# Patient Record
Sex: Female | Born: 1975 | Race: White | Hispanic: No | Marital: Married | Smoking: Never smoker
Health system: Southern US, Community
[De-identification: ages and names within clinical notes are randomized; demographics above are authoritative.]

## PROBLEM LIST (undated history)

## (undated) DIAGNOSIS — E559 Vitamin D deficiency, unspecified: Secondary | ICD-10-CM

## (undated) DIAGNOSIS — M549 Dorsalgia, unspecified: Secondary | ICD-10-CM

## (undated) DIAGNOSIS — R42 Dizziness and giddiness: Secondary | ICD-10-CM

## (undated) DIAGNOSIS — M542 Cervicalgia: Secondary | ICD-10-CM

## (undated) DIAGNOSIS — C4491 Basal cell carcinoma of skin, unspecified: Secondary | ICD-10-CM

## (undated) HISTORY — DX: Dorsalgia, unspecified: M54.9

## (undated) HISTORY — DX: Basal cell carcinoma of skin, unspecified: C44.91

## (undated) HISTORY — PX: APPENDECTOMY: SHX54

## (undated) HISTORY — DX: Vitamin D deficiency, unspecified: E55.9

## (undated) HISTORY — PX: BASAL CELL CARCINOMA EXCISION: SHX1214

## (undated) HISTORY — DX: Cervicalgia: M54.2

## (undated) HISTORY — DX: Dizziness and giddiness: R42

---

## 2015-11-21 DIAGNOSIS — Z01419 Encounter for gynecological examination (general) (routine) without abnormal findings: Secondary | ICD-10-CM | POA: Diagnosis not present

## 2015-11-21 DIAGNOSIS — Z6826 Body mass index (BMI) 26.0-26.9, adult: Secondary | ICD-10-CM | POA: Diagnosis not present

## 2015-11-26 DIAGNOSIS — M47816 Spondylosis without myelopathy or radiculopathy, lumbar region: Secondary | ICD-10-CM | POA: Diagnosis not present

## 2015-11-26 DIAGNOSIS — M9901 Segmental and somatic dysfunction of cervical region: Secondary | ICD-10-CM | POA: Diagnosis not present

## 2015-11-26 DIAGNOSIS — M9902 Segmental and somatic dysfunction of thoracic region: Secondary | ICD-10-CM | POA: Diagnosis not present

## 2015-11-26 DIAGNOSIS — M9903 Segmental and somatic dysfunction of lumbar region: Secondary | ICD-10-CM | POA: Diagnosis not present

## 2015-11-30 DIAGNOSIS — D2271 Melanocytic nevi of right lower limb, including hip: Secondary | ICD-10-CM | POA: Diagnosis not present

## 2015-11-30 DIAGNOSIS — D225 Melanocytic nevi of trunk: Secondary | ICD-10-CM | POA: Diagnosis not present

## 2015-11-30 DIAGNOSIS — D2272 Melanocytic nevi of left lower limb, including hip: Secondary | ICD-10-CM | POA: Diagnosis not present

## 2015-11-30 DIAGNOSIS — D2239 Melanocytic nevi of other parts of face: Secondary | ICD-10-CM | POA: Diagnosis not present

## 2015-12-11 DIAGNOSIS — M9902 Segmental and somatic dysfunction of thoracic region: Secondary | ICD-10-CM | POA: Diagnosis not present

## 2015-12-11 DIAGNOSIS — M9903 Segmental and somatic dysfunction of lumbar region: Secondary | ICD-10-CM | POA: Diagnosis not present

## 2015-12-11 DIAGNOSIS — M47816 Spondylosis without myelopathy or radiculopathy, lumbar region: Secondary | ICD-10-CM | POA: Diagnosis not present

## 2015-12-11 DIAGNOSIS — M9901 Segmental and somatic dysfunction of cervical region: Secondary | ICD-10-CM | POA: Diagnosis not present

## 2016-01-01 DIAGNOSIS — M9901 Segmental and somatic dysfunction of cervical region: Secondary | ICD-10-CM | POA: Diagnosis not present

## 2016-01-01 DIAGNOSIS — M9902 Segmental and somatic dysfunction of thoracic region: Secondary | ICD-10-CM | POA: Diagnosis not present

## 2016-01-01 DIAGNOSIS — M9903 Segmental and somatic dysfunction of lumbar region: Secondary | ICD-10-CM | POA: Diagnosis not present

## 2016-01-01 DIAGNOSIS — M47816 Spondylosis without myelopathy or radiculopathy, lumbar region: Secondary | ICD-10-CM | POA: Diagnosis not present

## 2016-01-17 DIAGNOSIS — M9902 Segmental and somatic dysfunction of thoracic region: Secondary | ICD-10-CM | POA: Diagnosis not present

## 2016-01-17 DIAGNOSIS — M9901 Segmental and somatic dysfunction of cervical region: Secondary | ICD-10-CM | POA: Diagnosis not present

## 2016-01-17 DIAGNOSIS — M47816 Spondylosis without myelopathy or radiculopathy, lumbar region: Secondary | ICD-10-CM | POA: Diagnosis not present

## 2016-01-17 DIAGNOSIS — M9903 Segmental and somatic dysfunction of lumbar region: Secondary | ICD-10-CM | POA: Diagnosis not present

## 2016-02-04 DIAGNOSIS — M9902 Segmental and somatic dysfunction of thoracic region: Secondary | ICD-10-CM | POA: Diagnosis not present

## 2016-02-04 DIAGNOSIS — M47816 Spondylosis without myelopathy or radiculopathy, lumbar region: Secondary | ICD-10-CM | POA: Diagnosis not present

## 2016-02-04 DIAGNOSIS — M9903 Segmental and somatic dysfunction of lumbar region: Secondary | ICD-10-CM | POA: Diagnosis not present

## 2016-02-04 DIAGNOSIS — M9901 Segmental and somatic dysfunction of cervical region: Secondary | ICD-10-CM | POA: Diagnosis not present

## 2016-02-06 DIAGNOSIS — M9903 Segmental and somatic dysfunction of lumbar region: Secondary | ICD-10-CM | POA: Diagnosis not present

## 2016-02-06 DIAGNOSIS — M47816 Spondylosis without myelopathy or radiculopathy, lumbar region: Secondary | ICD-10-CM | POA: Diagnosis not present

## 2016-02-06 DIAGNOSIS — M9901 Segmental and somatic dysfunction of cervical region: Secondary | ICD-10-CM | POA: Diagnosis not present

## 2016-02-06 DIAGNOSIS — M9902 Segmental and somatic dysfunction of thoracic region: Secondary | ICD-10-CM | POA: Diagnosis not present

## 2016-02-11 DIAGNOSIS — M47816 Spondylosis without myelopathy or radiculopathy, lumbar region: Secondary | ICD-10-CM | POA: Diagnosis not present

## 2016-02-11 DIAGNOSIS — M9902 Segmental and somatic dysfunction of thoracic region: Secondary | ICD-10-CM | POA: Diagnosis not present

## 2016-02-11 DIAGNOSIS — M9903 Segmental and somatic dysfunction of lumbar region: Secondary | ICD-10-CM | POA: Diagnosis not present

## 2016-02-11 DIAGNOSIS — M9901 Segmental and somatic dysfunction of cervical region: Secondary | ICD-10-CM | POA: Diagnosis not present

## 2016-02-13 DIAGNOSIS — M47816 Spondylosis without myelopathy or radiculopathy, lumbar region: Secondary | ICD-10-CM | POA: Diagnosis not present

## 2016-02-13 DIAGNOSIS — M9903 Segmental and somatic dysfunction of lumbar region: Secondary | ICD-10-CM | POA: Diagnosis not present

## 2016-02-13 DIAGNOSIS — M9902 Segmental and somatic dysfunction of thoracic region: Secondary | ICD-10-CM | POA: Diagnosis not present

## 2016-02-13 DIAGNOSIS — M9901 Segmental and somatic dysfunction of cervical region: Secondary | ICD-10-CM | POA: Diagnosis not present

## 2016-02-26 DIAGNOSIS — M9903 Segmental and somatic dysfunction of lumbar region: Secondary | ICD-10-CM | POA: Diagnosis not present

## 2016-02-26 DIAGNOSIS — M9902 Segmental and somatic dysfunction of thoracic region: Secondary | ICD-10-CM | POA: Diagnosis not present

## 2016-02-26 DIAGNOSIS — M47816 Spondylosis without myelopathy or radiculopathy, lumbar region: Secondary | ICD-10-CM | POA: Diagnosis not present

## 2016-02-26 DIAGNOSIS — M9901 Segmental and somatic dysfunction of cervical region: Secondary | ICD-10-CM | POA: Diagnosis not present

## 2016-02-28 DIAGNOSIS — R42 Dizziness and giddiness: Secondary | ICD-10-CM | POA: Diagnosis not present

## 2016-03-04 ENCOUNTER — Encounter: Payer: Self-pay | Admitting: Neurology

## 2016-03-04 ENCOUNTER — Ambulatory Visit (INDEPENDENT_AMBULATORY_CARE_PROVIDER_SITE_OTHER): Payer: BLUE CROSS/BLUE SHIELD | Admitting: Neurology

## 2016-03-04 VITALS — BP 103/71 | HR 57 | Ht 67.5 in | Wt 166.5 lb

## 2016-03-04 DIAGNOSIS — M542 Cervicalgia: Secondary | ICD-10-CM | POA: Diagnosis not present

## 2016-03-04 DIAGNOSIS — R42 Dizziness and giddiness: Secondary | ICD-10-CM

## 2016-03-04 NOTE — Progress Notes (Signed)
PATIENT: Holly Atkinson DOB: April 14, 1976  Chief Complaint  Patient presents with  . Dizziness    Lying: 103/71, 57, Sittting: 104/69, 67, Standing: 106/76, 72.  She started having intermittent dizzy spells on 02/04/16, after returning home from a work trip.  Her symptoms are typically better when she wakes up the mornings and gets worse throughout the day.  She has chronic back/neck pain and sees a chiropractor regularly.     HISTORICAL  Holly Atkinson is a 40 years old right-handed female, seen in refer by her primary care physician Dr. Deland Pretty for evaluation of dizziness on March 04 2016  I reviewed and summarized in note, in May and June, she traveled extensively, when she gets back home, around February 04 2016, she noticed intermittent dizziness, described as feeling fuzzy, generalized fatigue, difficulty concentrating, she has to repeat message few times on her computer to be able to respond, she denies visual loss, no vertigo, no swallowing difficulty, no lateralized motor or sensory deficit, no headaches, no loss of consciousness,  She was diagnosed with subclinical hypothyroidism at [facility] in December 2016, was not given medication treatment, recent laboratory evaluation CBC CMP showed no significant abnormality per patient.  She denies history of migraine headaches  REVIEW OF SYSTEMS: Full 14 system review of systems performed and notable only for Achy muscles, dizziness, fatigue  ALLERGIES: Allergies  Allergen Reactions  . Penicillins Nausea And Vomiting    HOME MEDICATIONS: Current Outpatient Prescriptions  Medication Sig Dispense Refill  . L-GLUTAMINE PO Take by mouth daily.    . Probiotic Product (PROBIOMAX DAILY DF PO) Take by mouth daily.     No current facility-administered medications for this visit.    PAST MEDICAL HISTORY: Past Medical History  Diagnosis Date  . Vitamin D deficiency   . Basal cell carcinoma   . Dizziness   . Neck pain   .  Back pain     PAST SURGICAL HISTORY: Past Surgical History  Procedure Laterality Date  . Appendectomy    . Basal cell carcinoma excision      FAMILY HISTORY: Family History  Problem Relation Age of Onset  . Prostate cancer Father   . Prostate cancer Paternal Grandfather   . Arthritis Maternal Grandmother   . Arthritis Paternal Grandmother   . Healthy Mother     SOCIAL HISTORY:  Social History   Social History  . Marital Status: Married    Spouse Name: N/A  . Number of Children: 0  . Years of Education: Bachelors   Occupational History  . Contractor/Educational    Social History Main Topics  . Smoking status: Never Smoker   . Smokeless tobacco: Not on file  . Alcohol Use: 0.0 oz/week    0 Standard drinks or equivalent per week     Comment: 4-6 ounces of alcohol, 2-3 times weekly.  . Drug Use: No  . Sexual Activity: Not on file   Other Topics Concern  . Not on file   Social History Narrative   Lives at home with husband.   Right-handed.   1-2 cups of coffee weekly.     PHYSICAL EXAM   Filed Vitals:   03/04/16 1317  BP: 103/71  Pulse: 57  Height: 5' 7.5" (1.715 m)  Weight: 166 lb 8 oz (75.524 kg)    Not recorded      Body mass index is 25.68 kg/(m^2).  PHYSICAL EXAMNIATION:  Gen: NAD, conversant, well nourised, obese, well groomed  Cardiovascular: Regular rate rhythm, no peripheral edema, warm, nontender. Eyes: Conjunctivae clear without exudates or hemorrhage Neck: Supple, no carotid bruise. Pulmonary: Clear to auscultation bilaterally   NEUROLOGICAL EXAM:  MENTAL STATUS: Speech:    Speech is normal; fluent and spontaneous with normal comprehension.  Cognition:     Orientation to time, place and person     Normal recent and remote memory     Normal Attention span and concentration     Normal Language, naming, repeating,spontaneous speech     Fund of knowledge   CRANIAL NERVES: CN II: Visual fields are full to  confrontation. Fundoscopic exam is normal with sharp discs and no vascular changes. Pupils are round equal and briskly reactive to light. CN III, IV, VI: extraocular movement are normal. No ptosis. CN V: Facial sensation is intact to pinprick in all 3 divisions bilaterally. Corneal responses are intact.  CN VII: Face is symmetric with normal eye closure and smile. CN VIII: Hearing is normal to rubbing fingers CN IX, X: Palate elevates symmetrically. Phonation is normal. CN XI: Head turning and shoulder shrug are intact CN XII: Tongue is midline with normal movements and no atrophy.  MOTOR: There is no pronator drift of out-stretched arms. Muscle bulk and tone are normal. Muscle strength is normal.  REFLEXES: Reflexes are 2+ and symmetric at the biceps, triceps, knees, and ankles. Plantar responses are flexor.  SENSORY: Intact to light touch, pinprick, positional sensation and vibratory sensation are intact in fingers and toes.  COORDINATION: Rapid alternating movements and fine finger movements are intact. There is no dysmetria on finger-to-nose and heel-knee-shin.    GAIT/STANCE: Posture is normal. Gait is steady with normal steps, base, arm swing, and turning. Heel and toe walking are normal. Tandem gait is normal.  Romberg is absent.   DIAGNOSTIC DATA (LABS, IMAGING, TESTING) - I reviewed patient records, labs, notes, testing and imaging myself where available.   ASSESSMENT AND PLAN  Holly Atkinson is a 40 y.o. female   New onset dizziness sensation, difficulty focus  Normal neurological examination, no focal signs and no focal complaints  Differentiation diagnosis including migraine variants, versus thyroid malfunction  Check TSH, B12, Vit D level, I will release result to My chart,   Return to clinic for persistent symptom     Marcial Pacas, M.D. Ph.D.  [facility] [address] [place], [place] [address] Ph: [phone] Fax:  [phone]  CC: Deland Pretty, MD

## 2016-03-05 ENCOUNTER — Telehealth: Payer: Self-pay | Admitting: Neurology

## 2016-03-05 DIAGNOSIS — M9901 Segmental and somatic dysfunction of cervical region: Secondary | ICD-10-CM | POA: Diagnosis not present

## 2016-03-05 DIAGNOSIS — M47816 Spondylosis without myelopathy or radiculopathy, lumbar region: Secondary | ICD-10-CM | POA: Diagnosis not present

## 2016-03-05 DIAGNOSIS — M9902 Segmental and somatic dysfunction of thoracic region: Secondary | ICD-10-CM | POA: Diagnosis not present

## 2016-03-05 DIAGNOSIS — M9903 Segmental and somatic dysfunction of lumbar region: Secondary | ICD-10-CM | POA: Diagnosis not present

## 2016-03-05 LAB — THYROID PANEL WITH TSH
Free Thyroxine Index: 1.7 (ref 1.2–4.9)
T3 Uptake Ratio: 26 % (ref 24–39)
T4, Total: 6.6 ug/dL (ref 4.5–12.0)
TSH: 3.03 u[IU]/mL (ref 0.450–4.500)

## 2016-03-05 LAB — VITAMIN B12: Vitamin B-12: 373 pg/mL (ref 211–946)

## 2016-03-05 LAB — VITAMIN D 25 HYDROXY (VIT D DEFICIENCY, FRACTURES): Vit D, 25-Hydroxy: 60.6 ng/mL (ref 30.0–100.0)

## 2016-03-05 NOTE — Telephone Encounter (Signed)
Please call patient for normal laboratory evaluation

## 2016-03-05 NOTE — Telephone Encounter (Signed)
Notified patient of normal results.

## 2016-03-10 NOTE — Telephone Encounter (Signed)
She has placed a call to the mychart help desk for records review.  She would like her results faxed to Dr. Gwyndolyn Saxon in [place] for her appt tomorrow [phone]).  Information faxed and confirmed.

## 2016-03-10 NOTE — Telephone Encounter (Signed)
Left a message for a return call.

## 2016-03-10 NOTE — Telephone Encounter (Signed)
Patient called to advise, results are not available in MyChart yet.

## 2016-03-10 NOTE — Telephone Encounter (Signed)
Noted/fim 

## 2016-03-10 NOTE — Telephone Encounter (Signed)
Patient also advises these results are time sensitive, has appointment tomorrow and would like these results to be available in MyChart for this appointment.

## 2016-03-18 DIAGNOSIS — E039 Hypothyroidism, unspecified: Secondary | ICD-10-CM | POA: Diagnosis not present

## 2016-04-01 DIAGNOSIS — M9902 Segmental and somatic dysfunction of thoracic region: Secondary | ICD-10-CM | POA: Diagnosis not present

## 2016-04-01 DIAGNOSIS — M47816 Spondylosis without myelopathy or radiculopathy, lumbar region: Secondary | ICD-10-CM | POA: Diagnosis not present

## 2016-04-01 DIAGNOSIS — M9903 Segmental and somatic dysfunction of lumbar region: Secondary | ICD-10-CM | POA: Diagnosis not present

## 2016-04-01 DIAGNOSIS — M9901 Segmental and somatic dysfunction of cervical region: Secondary | ICD-10-CM | POA: Diagnosis not present

## 2016-04-10 DIAGNOSIS — G4720 Circadian rhythm sleep disorder, unspecified type: Secondary | ICD-10-CM | POA: Diagnosis not present

## 2016-04-15 DIAGNOSIS — M9902 Segmental and somatic dysfunction of thoracic region: Secondary | ICD-10-CM | POA: Diagnosis not present

## 2016-04-15 DIAGNOSIS — M47816 Spondylosis without myelopathy or radiculopathy, lumbar region: Secondary | ICD-10-CM | POA: Diagnosis not present

## 2016-04-15 DIAGNOSIS — M9903 Segmental and somatic dysfunction of lumbar region: Secondary | ICD-10-CM | POA: Diagnosis not present

## 2016-04-15 DIAGNOSIS — M9901 Segmental and somatic dysfunction of cervical region: Secondary | ICD-10-CM | POA: Diagnosis not present

## 2016-04-18 DIAGNOSIS — R410 Disorientation, unspecified: Secondary | ICD-10-CM | POA: Diagnosis not present

## 2016-04-18 DIAGNOSIS — E039 Hypothyroidism, unspecified: Secondary | ICD-10-CM | POA: Diagnosis not present

## 2016-04-18 DIAGNOSIS — R42 Dizziness and giddiness: Secondary | ICD-10-CM | POA: Diagnosis not present

## 2016-05-01 DIAGNOSIS — M9902 Segmental and somatic dysfunction of thoracic region: Secondary | ICD-10-CM | POA: Diagnosis not present

## 2016-05-01 DIAGNOSIS — M9903 Segmental and somatic dysfunction of lumbar region: Secondary | ICD-10-CM | POA: Diagnosis not present

## 2016-05-01 DIAGNOSIS — M47816 Spondylosis without myelopathy or radiculopathy, lumbar region: Secondary | ICD-10-CM | POA: Diagnosis not present

## 2016-05-01 DIAGNOSIS — M9901 Segmental and somatic dysfunction of cervical region: Secondary | ICD-10-CM | POA: Diagnosis not present

## 2016-05-14 DIAGNOSIS — M9901 Segmental and somatic dysfunction of cervical region: Secondary | ICD-10-CM | POA: Diagnosis not present

## 2016-05-14 DIAGNOSIS — M9903 Segmental and somatic dysfunction of lumbar region: Secondary | ICD-10-CM | POA: Diagnosis not present

## 2016-05-14 DIAGNOSIS — M9902 Segmental and somatic dysfunction of thoracic region: Secondary | ICD-10-CM | POA: Diagnosis not present

## 2016-05-14 DIAGNOSIS — M47816 Spondylosis without myelopathy or radiculopathy, lumbar region: Secondary | ICD-10-CM | POA: Diagnosis not present

## 2016-06-10 DIAGNOSIS — M9901 Segmental and somatic dysfunction of cervical region: Secondary | ICD-10-CM | POA: Diagnosis not present

## 2016-06-10 DIAGNOSIS — M9902 Segmental and somatic dysfunction of thoracic region: Secondary | ICD-10-CM | POA: Diagnosis not present

## 2016-06-10 DIAGNOSIS — M9903 Segmental and somatic dysfunction of lumbar region: Secondary | ICD-10-CM | POA: Diagnosis not present

## 2016-06-10 DIAGNOSIS — M47816 Spondylosis without myelopathy or radiculopathy, lumbar region: Secondary | ICD-10-CM | POA: Diagnosis not present

## 2016-06-12 ENCOUNTER — Other Ambulatory Visit: Payer: Self-pay | Admitting: Obstetrics and Gynecology

## 2016-06-12 DIAGNOSIS — N6310 Unspecified lump in the right breast, unspecified quadrant: Secondary | ICD-10-CM

## 2016-06-13 ENCOUNTER — Ambulatory Visit
Admission: RE | Admit: 2016-06-13 | Discharge: 2016-06-13 | Disposition: A | Discharge status: Home / Self Care | Payer: BLUE CROSS/BLUE SHIELD | Source: Ambulatory Visit | Attending: Obstetrics and Gynecology | Admitting: Obstetrics and Gynecology

## 2016-06-13 DIAGNOSIS — N6313 Unspecified lump in the right breast, lower outer quadrant: Secondary | ICD-10-CM | POA: Diagnosis not present

## 2016-06-13 DIAGNOSIS — N6310 Unspecified lump in the right breast, unspecified quadrant: Secondary | ICD-10-CM

## 2016-07-07 DIAGNOSIS — M9902 Segmental and somatic dysfunction of thoracic region: Secondary | ICD-10-CM | POA: Diagnosis not present

## 2016-07-07 DIAGNOSIS — M9903 Segmental and somatic dysfunction of lumbar region: Secondary | ICD-10-CM | POA: Diagnosis not present

## 2016-07-07 DIAGNOSIS — M47816 Spondylosis without myelopathy or radiculopathy, lumbar region: Secondary | ICD-10-CM | POA: Diagnosis not present

## 2016-07-07 DIAGNOSIS — M9901 Segmental and somatic dysfunction of cervical region: Secondary | ICD-10-CM | POA: Diagnosis not present

## 2016-07-08 DIAGNOSIS — N6001 Solitary cyst of right breast: Secondary | ICD-10-CM | POA: Diagnosis not present

## 2016-08-01 DIAGNOSIS — Z0000 Encounter for general adult medical examination without abnormal findings: Secondary | ICD-10-CM | POA: Diagnosis not present

## 2016-08-01 DIAGNOSIS — E559 Vitamin D deficiency, unspecified: Secondary | ICD-10-CM | POA: Diagnosis not present

## 2016-08-06 DIAGNOSIS — M9903 Segmental and somatic dysfunction of lumbar region: Secondary | ICD-10-CM | POA: Diagnosis not present

## 2016-08-06 DIAGNOSIS — M9901 Segmental and somatic dysfunction of cervical region: Secondary | ICD-10-CM | POA: Diagnosis not present

## 2016-08-06 DIAGNOSIS — M47816 Spondylosis without myelopathy or radiculopathy, lumbar region: Secondary | ICD-10-CM | POA: Diagnosis not present

## 2016-08-06 DIAGNOSIS — M9902 Segmental and somatic dysfunction of thoracic region: Secondary | ICD-10-CM | POA: Diagnosis not present

## 2016-08-08 DIAGNOSIS — R42 Dizziness and giddiness: Secondary | ICD-10-CM | POA: Diagnosis not present

## 2016-08-08 DIAGNOSIS — E039 Hypothyroidism, unspecified: Secondary | ICD-10-CM | POA: Diagnosis not present

## 2016-08-08 DIAGNOSIS — R410 Disorientation, unspecified: Secondary | ICD-10-CM | POA: Diagnosis not present

## 2016-08-19 DIAGNOSIS — M9901 Segmental and somatic dysfunction of cervical region: Secondary | ICD-10-CM | POA: Diagnosis not present

## 2016-08-19 DIAGNOSIS — Z0000 Encounter for general adult medical examination without abnormal findings: Secondary | ICD-10-CM | POA: Diagnosis not present

## 2016-08-19 DIAGNOSIS — M542 Cervicalgia: Secondary | ICD-10-CM | POA: Diagnosis not present

## 2016-08-19 DIAGNOSIS — M9903 Segmental and somatic dysfunction of lumbar region: Secondary | ICD-10-CM | POA: Diagnosis not present

## 2016-08-19 DIAGNOSIS — D72818 Other decreased white blood cell count: Secondary | ICD-10-CM | POA: Diagnosis not present

## 2016-08-19 DIAGNOSIS — M47816 Spondylosis without myelopathy or radiculopathy, lumbar region: Secondary | ICD-10-CM | POA: Diagnosis not present

## 2016-08-19 DIAGNOSIS — Z8639 Personal history of other endocrine, nutritional and metabolic disease: Secondary | ICD-10-CM | POA: Diagnosis not present

## 2016-08-19 DIAGNOSIS — E039 Hypothyroidism, unspecified: Secondary | ICD-10-CM | POA: Diagnosis not present

## 2016-08-19 DIAGNOSIS — M9902 Segmental and somatic dysfunction of thoracic region: Secondary | ICD-10-CM | POA: Diagnosis not present

## 2016-09-02 DIAGNOSIS — M47816 Spondylosis without myelopathy or radiculopathy, lumbar region: Secondary | ICD-10-CM | POA: Diagnosis not present

## 2016-09-02 DIAGNOSIS — M9903 Segmental and somatic dysfunction of lumbar region: Secondary | ICD-10-CM | POA: Diagnosis not present

## 2016-09-02 DIAGNOSIS — M9902 Segmental and somatic dysfunction of thoracic region: Secondary | ICD-10-CM | POA: Diagnosis not present

## 2016-09-02 DIAGNOSIS — M9901 Segmental and somatic dysfunction of cervical region: Secondary | ICD-10-CM | POA: Diagnosis not present

## 2016-09-23 DIAGNOSIS — M47816 Spondylosis without myelopathy or radiculopathy, lumbar region: Secondary | ICD-10-CM | POA: Diagnosis not present

## 2016-09-23 DIAGNOSIS — M9902 Segmental and somatic dysfunction of thoracic region: Secondary | ICD-10-CM | POA: Diagnosis not present

## 2016-09-23 DIAGNOSIS — M9901 Segmental and somatic dysfunction of cervical region: Secondary | ICD-10-CM | POA: Diagnosis not present

## 2016-09-23 DIAGNOSIS — M9903 Segmental and somatic dysfunction of lumbar region: Secondary | ICD-10-CM | POA: Diagnosis not present

## 2016-11-04 DIAGNOSIS — H1045 Other chronic allergic conjunctivitis: Secondary | ICD-10-CM | POA: Diagnosis not present

## 2016-11-04 DIAGNOSIS — R1031 Right lower quadrant pain: Secondary | ICD-10-CM | POA: Diagnosis not present

## 2016-11-04 DIAGNOSIS — H00025 Hordeolum internum left lower eyelid: Secondary | ICD-10-CM | POA: Diagnosis not present

## 2016-11-05 ENCOUNTER — Other Ambulatory Visit (HOSPITAL_COMMUNITY): Payer: Self-pay | Admitting: Obstetrics and Gynecology

## 2016-11-05 DIAGNOSIS — Z9049 Acquired absence of other specified parts of digestive tract: Secondary | ICD-10-CM

## 2016-11-05 DIAGNOSIS — R1031 Right lower quadrant pain: Secondary | ICD-10-CM

## 2016-11-07 ENCOUNTER — Ambulatory Visit (HOSPITAL_COMMUNITY): Admission: RE | Admit: 2016-11-07 | Payer: BLUE CROSS/BLUE SHIELD | Source: Ambulatory Visit

## 2016-11-07 DIAGNOSIS — E039 Hypothyroidism, unspecified: Secondary | ICD-10-CM | POA: Diagnosis not present

## 2016-11-07 DIAGNOSIS — R410 Disorientation, unspecified: Secondary | ICD-10-CM | POA: Diagnosis not present

## 2016-11-07 DIAGNOSIS — R42 Dizziness and giddiness: Secondary | ICD-10-CM | POA: Diagnosis not present

## 2017-01-20 DIAGNOSIS — Z6824 Body mass index (BMI) 24.0-24.9, adult: Secondary | ICD-10-CM | POA: Diagnosis not present

## 2017-01-20 DIAGNOSIS — Z01419 Encounter for gynecological examination (general) (routine) without abnormal findings: Secondary | ICD-10-CM | POA: Diagnosis not present

## 2017-02-26 DIAGNOSIS — R1031 Right lower quadrant pain: Secondary | ICD-10-CM | POA: Diagnosis not present

## 2017-03-17 DIAGNOSIS — S4351XA Sprain of right acromioclavicular joint, initial encounter: Secondary | ICD-10-CM | POA: Diagnosis not present

## 2017-03-17 DIAGNOSIS — M9901 Segmental and somatic dysfunction of cervical region: Secondary | ICD-10-CM | POA: Diagnosis not present

## 2017-03-17 DIAGNOSIS — M9907 Segmental and somatic dysfunction of upper extremity: Secondary | ICD-10-CM | POA: Diagnosis not present

## 2017-03-17 DIAGNOSIS — M4722 Other spondylosis with radiculopathy, cervical region: Secondary | ICD-10-CM | POA: Diagnosis not present

## 2017-03-19 DIAGNOSIS — M4722 Other spondylosis with radiculopathy, cervical region: Secondary | ICD-10-CM | POA: Diagnosis not present

## 2017-03-19 DIAGNOSIS — M9901 Segmental and somatic dysfunction of cervical region: Secondary | ICD-10-CM | POA: Diagnosis not present

## 2017-03-19 DIAGNOSIS — M9907 Segmental and somatic dysfunction of upper extremity: Secondary | ICD-10-CM | POA: Diagnosis not present

## 2017-03-19 DIAGNOSIS — S4351XA Sprain of right acromioclavicular joint, initial encounter: Secondary | ICD-10-CM | POA: Diagnosis not present

## 2017-04-03 ENCOUNTER — Other Ambulatory Visit: Payer: Self-pay | Admitting: Internal Medicine

## 2017-04-03 DIAGNOSIS — E0789 Other specified disorders of thyroid: Secondary | ICD-10-CM

## 2017-04-13 ENCOUNTER — Ambulatory Visit
Admission: RE | Admit: 2017-04-13 | Discharge: 2017-04-13 | Disposition: A | Discharge status: Home / Self Care | Payer: BLUE CROSS/BLUE SHIELD | Source: Ambulatory Visit | Attending: Internal Medicine | Admitting: Internal Medicine

## 2017-04-13 DIAGNOSIS — E0789 Other specified disorders of thyroid: Secondary | ICD-10-CM

## 2017-04-13 DIAGNOSIS — E049 Nontoxic goiter, unspecified: Secondary | ICD-10-CM | POA: Diagnosis not present

## 2017-04-13 DIAGNOSIS — M9907 Segmental and somatic dysfunction of upper extremity: Secondary | ICD-10-CM | POA: Diagnosis not present

## 2017-04-13 DIAGNOSIS — M9901 Segmental and somatic dysfunction of cervical region: Secondary | ICD-10-CM | POA: Diagnosis not present

## 2017-04-13 DIAGNOSIS — M4722 Other spondylosis with radiculopathy, cervical region: Secondary | ICD-10-CM | POA: Diagnosis not present

## 2017-04-13 DIAGNOSIS — S4351XA Sprain of right acromioclavicular joint, initial encounter: Secondary | ICD-10-CM | POA: Diagnosis not present

## 2017-04-15 DIAGNOSIS — M9901 Segmental and somatic dysfunction of cervical region: Secondary | ICD-10-CM | POA: Diagnosis not present

## 2017-04-15 DIAGNOSIS — M9907 Segmental and somatic dysfunction of upper extremity: Secondary | ICD-10-CM | POA: Diagnosis not present

## 2017-04-15 DIAGNOSIS — S4351XA Sprain of right acromioclavicular joint, initial encounter: Secondary | ICD-10-CM | POA: Diagnosis not present

## 2017-04-15 DIAGNOSIS — M4722 Other spondylosis with radiculopathy, cervical region: Secondary | ICD-10-CM | POA: Diagnosis not present

## 2017-04-29 DIAGNOSIS — M4722 Other spondylosis with radiculopathy, cervical region: Secondary | ICD-10-CM | POA: Diagnosis not present

## 2017-04-29 DIAGNOSIS — M9901 Segmental and somatic dysfunction of cervical region: Secondary | ICD-10-CM | POA: Diagnosis not present

## 2017-04-29 DIAGNOSIS — S4351XA Sprain of right acromioclavicular joint, initial encounter: Secondary | ICD-10-CM | POA: Diagnosis not present

## 2017-04-29 DIAGNOSIS — M9907 Segmental and somatic dysfunction of upper extremity: Secondary | ICD-10-CM | POA: Diagnosis not present

## 2017-04-30 DIAGNOSIS — M9907 Segmental and somatic dysfunction of upper extremity: Secondary | ICD-10-CM | POA: Diagnosis not present

## 2017-04-30 DIAGNOSIS — M4722 Other spondylosis with radiculopathy, cervical region: Secondary | ICD-10-CM | POA: Diagnosis not present

## 2017-04-30 DIAGNOSIS — S4351XA Sprain of right acromioclavicular joint, initial encounter: Secondary | ICD-10-CM | POA: Diagnosis not present

## 2017-04-30 DIAGNOSIS — M9901 Segmental and somatic dysfunction of cervical region: Secondary | ICD-10-CM | POA: Diagnosis not present

## 2017-05-01 DIAGNOSIS — R42 Dizziness and giddiness: Secondary | ICD-10-CM | POA: Diagnosis not present

## 2017-05-01 DIAGNOSIS — E039 Hypothyroidism, unspecified: Secondary | ICD-10-CM | POA: Diagnosis not present

## 2017-05-01 DIAGNOSIS — R410 Disorientation, unspecified: Secondary | ICD-10-CM | POA: Diagnosis not present

## 2017-05-04 DIAGNOSIS — M9901 Segmental and somatic dysfunction of cervical region: Secondary | ICD-10-CM | POA: Diagnosis not present

## 2017-05-04 DIAGNOSIS — S4351XA Sprain of right acromioclavicular joint, initial encounter: Secondary | ICD-10-CM | POA: Diagnosis not present

## 2017-05-04 DIAGNOSIS — M4722 Other spondylosis with radiculopathy, cervical region: Secondary | ICD-10-CM | POA: Diagnosis not present

## 2017-05-04 DIAGNOSIS — M9907 Segmental and somatic dysfunction of upper extremity: Secondary | ICD-10-CM | POA: Diagnosis not present

## 2017-05-07 DIAGNOSIS — M9907 Segmental and somatic dysfunction of upper extremity: Secondary | ICD-10-CM | POA: Diagnosis not present

## 2017-05-07 DIAGNOSIS — M9901 Segmental and somatic dysfunction of cervical region: Secondary | ICD-10-CM | POA: Diagnosis not present

## 2017-05-07 DIAGNOSIS — M4722 Other spondylosis with radiculopathy, cervical region: Secondary | ICD-10-CM | POA: Diagnosis not present

## 2017-05-07 DIAGNOSIS — S4351XA Sprain of right acromioclavicular joint, initial encounter: Secondary | ICD-10-CM | POA: Diagnosis not present

## 2017-05-12 DIAGNOSIS — M9901 Segmental and somatic dysfunction of cervical region: Secondary | ICD-10-CM | POA: Diagnosis not present

## 2017-05-12 DIAGNOSIS — S4351XA Sprain of right acromioclavicular joint, initial encounter: Secondary | ICD-10-CM | POA: Diagnosis not present

## 2017-05-12 DIAGNOSIS — M9907 Segmental and somatic dysfunction of upper extremity: Secondary | ICD-10-CM | POA: Diagnosis not present

## 2017-05-12 DIAGNOSIS — M4722 Other spondylosis with radiculopathy, cervical region: Secondary | ICD-10-CM | POA: Diagnosis not present

## 2017-05-13 DIAGNOSIS — M9907 Segmental and somatic dysfunction of upper extremity: Secondary | ICD-10-CM | POA: Diagnosis not present

## 2017-05-13 DIAGNOSIS — M4722 Other spondylosis with radiculopathy, cervical region: Secondary | ICD-10-CM | POA: Diagnosis not present

## 2017-05-13 DIAGNOSIS — M9901 Segmental and somatic dysfunction of cervical region: Secondary | ICD-10-CM | POA: Diagnosis not present

## 2017-05-13 DIAGNOSIS — S4351XA Sprain of right acromioclavicular joint, initial encounter: Secondary | ICD-10-CM | POA: Diagnosis not present

## 2017-05-13 DIAGNOSIS — F4320 Adjustment disorder, unspecified: Secondary | ICD-10-CM | POA: Diagnosis not present

## 2017-05-14 DIAGNOSIS — S4351XA Sprain of right acromioclavicular joint, initial encounter: Secondary | ICD-10-CM | POA: Diagnosis not present

## 2017-05-14 DIAGNOSIS — M9907 Segmental and somatic dysfunction of upper extremity: Secondary | ICD-10-CM | POA: Diagnosis not present

## 2017-05-14 DIAGNOSIS — M9901 Segmental and somatic dysfunction of cervical region: Secondary | ICD-10-CM | POA: Diagnosis not present

## 2017-05-14 DIAGNOSIS — M4722 Other spondylosis with radiculopathy, cervical region: Secondary | ICD-10-CM | POA: Diagnosis not present

## 2017-05-14 DIAGNOSIS — E039 Hypothyroidism, unspecified: Secondary | ICD-10-CM | POA: Diagnosis not present

## 2017-06-10 DIAGNOSIS — F4320 Adjustment disorder, unspecified: Secondary | ICD-10-CM | POA: Diagnosis not present

## 2017-07-14 DIAGNOSIS — E039 Hypothyroidism, unspecified: Secondary | ICD-10-CM | POA: Diagnosis not present

## 2017-07-16 ENCOUNTER — Other Ambulatory Visit: Payer: Self-pay | Admitting: Obstetrics and Gynecology

## 2017-07-16 DIAGNOSIS — Z1231 Encounter for screening mammogram for malignant neoplasm of breast: Secondary | ICD-10-CM

## 2017-07-20 DIAGNOSIS — M9907 Segmental and somatic dysfunction of upper extremity: Secondary | ICD-10-CM | POA: Diagnosis not present

## 2017-07-20 DIAGNOSIS — M4722 Other spondylosis with radiculopathy, cervical region: Secondary | ICD-10-CM | POA: Diagnosis not present

## 2017-07-20 DIAGNOSIS — S43421A Sprain of right rotator cuff capsule, initial encounter: Secondary | ICD-10-CM | POA: Diagnosis not present

## 2017-07-20 DIAGNOSIS — M9901 Segmental and somatic dysfunction of cervical region: Secondary | ICD-10-CM | POA: Diagnosis not present

## 2017-07-21 DIAGNOSIS — M9907 Segmental and somatic dysfunction of upper extremity: Secondary | ICD-10-CM | POA: Diagnosis not present

## 2017-07-21 DIAGNOSIS — M9901 Segmental and somatic dysfunction of cervical region: Secondary | ICD-10-CM | POA: Diagnosis not present

## 2017-07-21 DIAGNOSIS — M4722 Other spondylosis with radiculopathy, cervical region: Secondary | ICD-10-CM | POA: Diagnosis not present

## 2017-07-21 DIAGNOSIS — S43421A Sprain of right rotator cuff capsule, initial encounter: Secondary | ICD-10-CM | POA: Diagnosis not present

## 2017-07-22 DIAGNOSIS — S43421A Sprain of right rotator cuff capsule, initial encounter: Secondary | ICD-10-CM | POA: Diagnosis not present

## 2017-07-22 DIAGNOSIS — M9907 Segmental and somatic dysfunction of upper extremity: Secondary | ICD-10-CM | POA: Diagnosis not present

## 2017-07-22 DIAGNOSIS — M9901 Segmental and somatic dysfunction of cervical region: Secondary | ICD-10-CM | POA: Diagnosis not present

## 2017-07-22 DIAGNOSIS — M4722 Other spondylosis with radiculopathy, cervical region: Secondary | ICD-10-CM | POA: Diagnosis not present

## 2017-07-23 DIAGNOSIS — M4722 Other spondylosis with radiculopathy, cervical region: Secondary | ICD-10-CM | POA: Diagnosis not present

## 2017-07-23 DIAGNOSIS — S43421A Sprain of right rotator cuff capsule, initial encounter: Secondary | ICD-10-CM | POA: Diagnosis not present

## 2017-07-23 DIAGNOSIS — M9907 Segmental and somatic dysfunction of upper extremity: Secondary | ICD-10-CM | POA: Diagnosis not present

## 2017-07-23 DIAGNOSIS — M9901 Segmental and somatic dysfunction of cervical region: Secondary | ICD-10-CM | POA: Diagnosis not present

## 2017-07-28 DIAGNOSIS — M9907 Segmental and somatic dysfunction of upper extremity: Secondary | ICD-10-CM | POA: Diagnosis not present

## 2017-07-28 DIAGNOSIS — M9901 Segmental and somatic dysfunction of cervical region: Secondary | ICD-10-CM | POA: Diagnosis not present

## 2017-07-28 DIAGNOSIS — M4722 Other spondylosis with radiculopathy, cervical region: Secondary | ICD-10-CM | POA: Diagnosis not present

## 2017-07-28 DIAGNOSIS — S43421A Sprain of right rotator cuff capsule, initial encounter: Secondary | ICD-10-CM | POA: Diagnosis not present

## 2017-07-29 DIAGNOSIS — M4722 Other spondylosis with radiculopathy, cervical region: Secondary | ICD-10-CM | POA: Diagnosis not present

## 2017-07-29 DIAGNOSIS — S43421A Sprain of right rotator cuff capsule, initial encounter: Secondary | ICD-10-CM | POA: Diagnosis not present

## 2017-07-29 DIAGNOSIS — M9907 Segmental and somatic dysfunction of upper extremity: Secondary | ICD-10-CM | POA: Diagnosis not present

## 2017-07-29 DIAGNOSIS — M9901 Segmental and somatic dysfunction of cervical region: Secondary | ICD-10-CM | POA: Diagnosis not present

## 2017-07-30 DIAGNOSIS — S43421A Sprain of right rotator cuff capsule, initial encounter: Secondary | ICD-10-CM | POA: Diagnosis not present

## 2017-07-30 DIAGNOSIS — M9907 Segmental and somatic dysfunction of upper extremity: Secondary | ICD-10-CM | POA: Diagnosis not present

## 2017-07-30 DIAGNOSIS — M9901 Segmental and somatic dysfunction of cervical region: Secondary | ICD-10-CM | POA: Diagnosis not present

## 2017-07-30 DIAGNOSIS — R1031 Right lower quadrant pain: Secondary | ICD-10-CM | POA: Diagnosis not present

## 2017-07-30 DIAGNOSIS — M4722 Other spondylosis with radiculopathy, cervical region: Secondary | ICD-10-CM | POA: Diagnosis not present

## 2017-07-30 DIAGNOSIS — N390 Urinary tract infection, site not specified: Secondary | ICD-10-CM | POA: Diagnosis not present

## 2017-07-31 ENCOUNTER — Ambulatory Visit: Payer: BLUE CROSS/BLUE SHIELD

## 2017-07-31 ENCOUNTER — Ambulatory Visit
Admission: RE | Admit: 2017-07-31 | Discharge: 2017-07-31 | Disposition: A | Discharge status: Home / Self Care | Payer: BLUE CROSS/BLUE SHIELD | Source: Ambulatory Visit | Attending: Internal Medicine | Admitting: Internal Medicine

## 2017-07-31 ENCOUNTER — Other Ambulatory Visit: Payer: Self-pay | Admitting: Internal Medicine

## 2017-07-31 DIAGNOSIS — R1031 Right lower quadrant pain: Secondary | ICD-10-CM

## 2017-08-03 DIAGNOSIS — M9901 Segmental and somatic dysfunction of cervical region: Secondary | ICD-10-CM | POA: Diagnosis not present

## 2017-08-03 DIAGNOSIS — M4722 Other spondylosis with radiculopathy, cervical region: Secondary | ICD-10-CM | POA: Diagnosis not present

## 2017-08-03 DIAGNOSIS — S43421A Sprain of right rotator cuff capsule, initial encounter: Secondary | ICD-10-CM | POA: Diagnosis not present

## 2017-08-03 DIAGNOSIS — M9907 Segmental and somatic dysfunction of upper extremity: Secondary | ICD-10-CM | POA: Diagnosis not present

## 2017-08-04 DIAGNOSIS — M9907 Segmental and somatic dysfunction of upper extremity: Secondary | ICD-10-CM | POA: Diagnosis not present

## 2017-08-04 DIAGNOSIS — F4320 Adjustment disorder, unspecified: Secondary | ICD-10-CM | POA: Diagnosis not present

## 2017-08-04 DIAGNOSIS — S43421A Sprain of right rotator cuff capsule, initial encounter: Secondary | ICD-10-CM | POA: Diagnosis not present

## 2017-08-04 DIAGNOSIS — M9901 Segmental and somatic dysfunction of cervical region: Secondary | ICD-10-CM | POA: Diagnosis not present

## 2017-08-04 DIAGNOSIS — M4722 Other spondylosis with radiculopathy, cervical region: Secondary | ICD-10-CM | POA: Diagnosis not present

## 2017-08-05 DIAGNOSIS — M9901 Segmental and somatic dysfunction of cervical region: Secondary | ICD-10-CM | POA: Diagnosis not present

## 2017-08-05 DIAGNOSIS — S43421A Sprain of right rotator cuff capsule, initial encounter: Secondary | ICD-10-CM | POA: Diagnosis not present

## 2017-08-05 DIAGNOSIS — M4722 Other spondylosis with radiculopathy, cervical region: Secondary | ICD-10-CM | POA: Diagnosis not present

## 2017-08-05 DIAGNOSIS — M9907 Segmental and somatic dysfunction of upper extremity: Secondary | ICD-10-CM | POA: Diagnosis not present

## 2017-08-06 DIAGNOSIS — M9901 Segmental and somatic dysfunction of cervical region: Secondary | ICD-10-CM | POA: Diagnosis not present

## 2017-08-06 DIAGNOSIS — M4722 Other spondylosis with radiculopathy, cervical region: Secondary | ICD-10-CM | POA: Diagnosis not present

## 2017-08-06 DIAGNOSIS — S43421A Sprain of right rotator cuff capsule, initial encounter: Secondary | ICD-10-CM | POA: Diagnosis not present

## 2017-08-06 DIAGNOSIS — M9907 Segmental and somatic dysfunction of upper extremity: Secondary | ICD-10-CM | POA: Diagnosis not present

## 2017-08-18 HISTORY — PX: BREAST CYST ASPIRATION: SHX578

## 2017-08-20 DIAGNOSIS — M9907 Segmental and somatic dysfunction of upper extremity: Secondary | ICD-10-CM | POA: Diagnosis not present

## 2017-08-20 DIAGNOSIS — S43421A Sprain of right rotator cuff capsule, initial encounter: Secondary | ICD-10-CM | POA: Diagnosis not present

## 2017-08-20 DIAGNOSIS — M9901 Segmental and somatic dysfunction of cervical region: Secondary | ICD-10-CM | POA: Diagnosis not present

## 2017-08-20 DIAGNOSIS — M4722 Other spondylosis with radiculopathy, cervical region: Secondary | ICD-10-CM | POA: Diagnosis not present

## 2017-08-21 DIAGNOSIS — Z0000 Encounter for general adult medical examination without abnormal findings: Secondary | ICD-10-CM | POA: Diagnosis not present

## 2017-08-21 DIAGNOSIS — Z9049 Acquired absence of other specified parts of digestive tract: Secondary | ICD-10-CM | POA: Diagnosis not present

## 2017-08-21 DIAGNOSIS — Z8639 Personal history of other endocrine, nutritional and metabolic disease: Secondary | ICD-10-CM | POA: Diagnosis not present

## 2017-08-24 ENCOUNTER — Ambulatory Visit
Admission: RE | Admit: 2017-08-24 | Discharge: 2017-08-24 | Disposition: A | Discharge status: Home / Self Care | Payer: BLUE CROSS/BLUE SHIELD | Source: Ambulatory Visit | Attending: Obstetrics and Gynecology | Admitting: Obstetrics and Gynecology

## 2017-08-24 DIAGNOSIS — Z1231 Encounter for screening mammogram for malignant neoplasm of breast: Secondary | ICD-10-CM | POA: Diagnosis not present

## 2017-08-25 DIAGNOSIS — M9901 Segmental and somatic dysfunction of cervical region: Secondary | ICD-10-CM | POA: Diagnosis not present

## 2017-08-25 DIAGNOSIS — S43421A Sprain of right rotator cuff capsule, initial encounter: Secondary | ICD-10-CM | POA: Diagnosis not present

## 2017-08-25 DIAGNOSIS — M4722 Other spondylosis with radiculopathy, cervical region: Secondary | ICD-10-CM | POA: Diagnosis not present

## 2017-08-25 DIAGNOSIS — M9907 Segmental and somatic dysfunction of upper extremity: Secondary | ICD-10-CM | POA: Diagnosis not present

## 2017-08-26 DIAGNOSIS — Z23 Encounter for immunization: Secondary | ICD-10-CM | POA: Diagnosis not present

## 2017-08-26 DIAGNOSIS — Z0000 Encounter for general adult medical examination without abnormal findings: Secondary | ICD-10-CM | POA: Diagnosis not present

## 2017-09-01 DIAGNOSIS — S43421A Sprain of right rotator cuff capsule, initial encounter: Secondary | ICD-10-CM | POA: Diagnosis not present

## 2017-09-01 DIAGNOSIS — M4722 Other spondylosis with radiculopathy, cervical region: Secondary | ICD-10-CM | POA: Diagnosis not present

## 2017-09-01 DIAGNOSIS — M9907 Segmental and somatic dysfunction of upper extremity: Secondary | ICD-10-CM | POA: Diagnosis not present

## 2017-09-01 DIAGNOSIS — F4320 Adjustment disorder, unspecified: Secondary | ICD-10-CM | POA: Diagnosis not present

## 2017-09-01 DIAGNOSIS — M9901 Segmental and somatic dysfunction of cervical region: Secondary | ICD-10-CM | POA: Diagnosis not present

## 2017-09-10 IMAGING — US US THYROID
1 series · 14 of 25 positions shown · non-contrast
Comparison: None.

CLINICAL DATA: Right thyroid fullness on exam

EXAM:
THYROID ULTRASOUND
TECHNIQUE: Ultrasound examination of the thyroid gland and adjacent soft
tissues was performed.

[Series 1: us thyroid · 0.05mm/px · 14 of 33 slices shown]
[im 1/33]
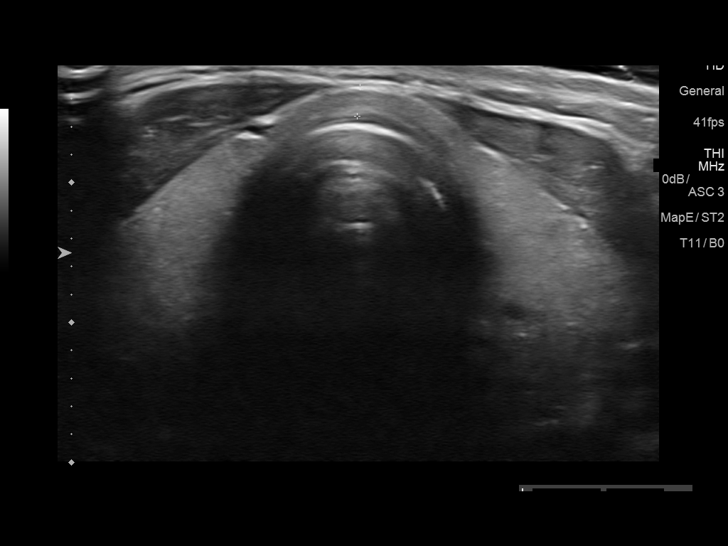
[im 3/33]
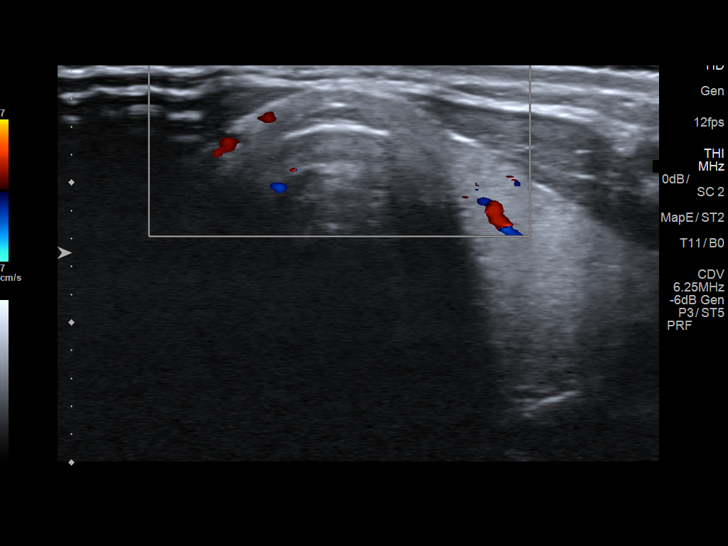
[im 6/33]
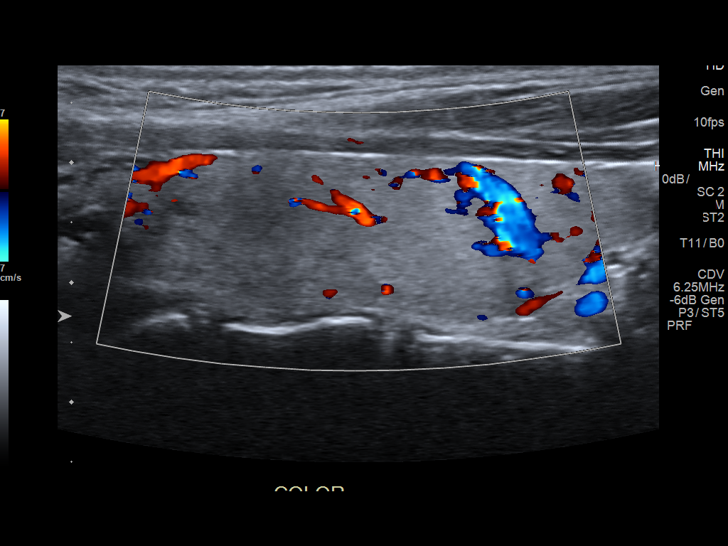
[im 9/33]
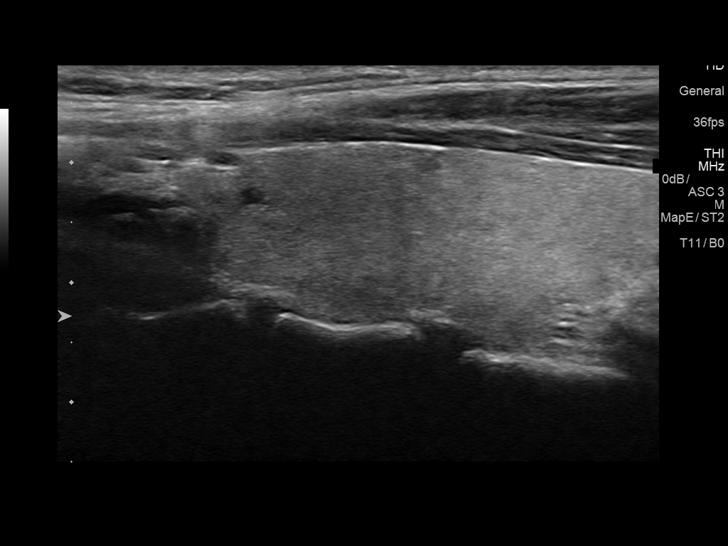
[im 11/33]
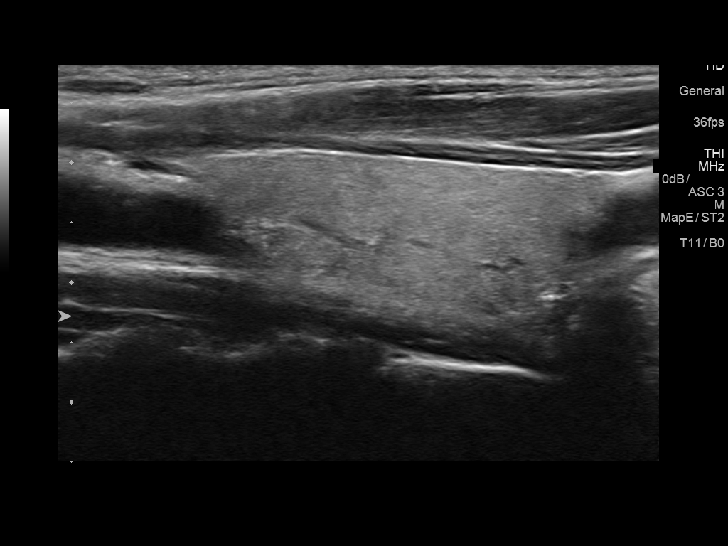
[im 13/33]
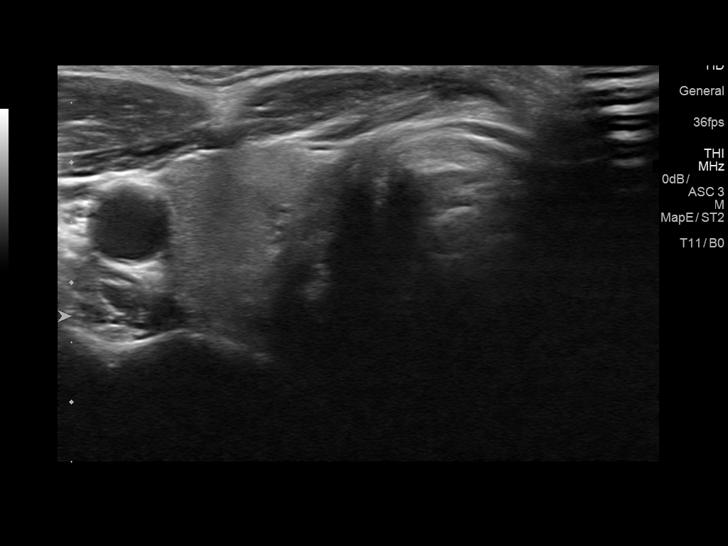
[im 15/33]
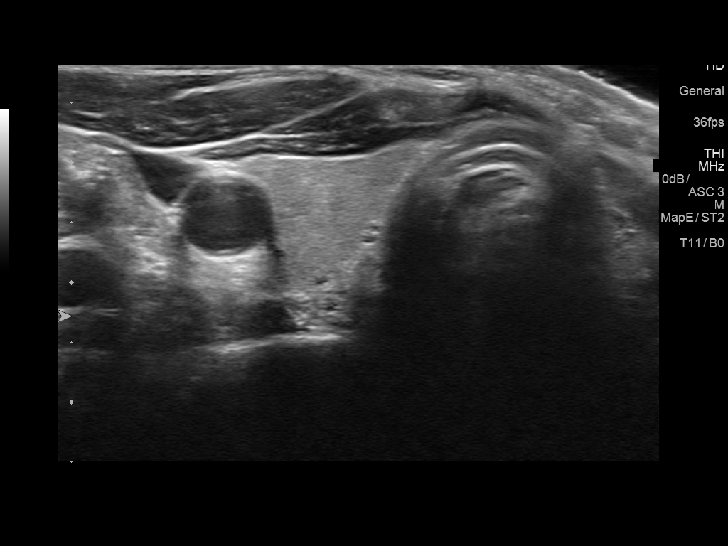
[im 18/33]
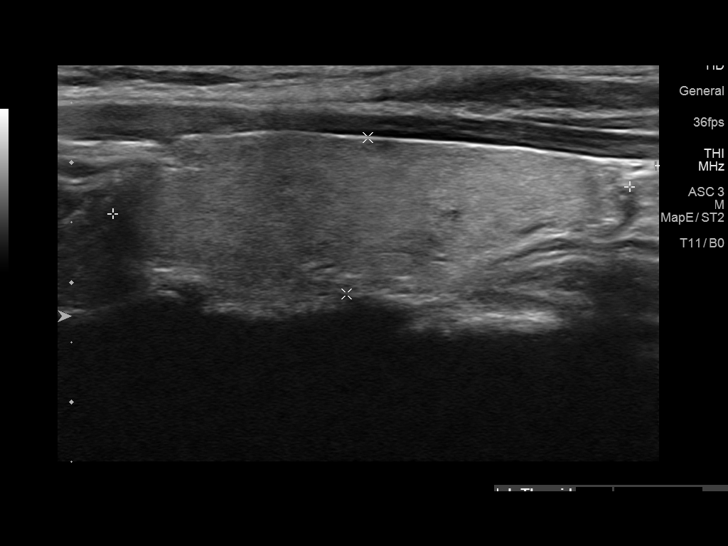
[im 21/33]
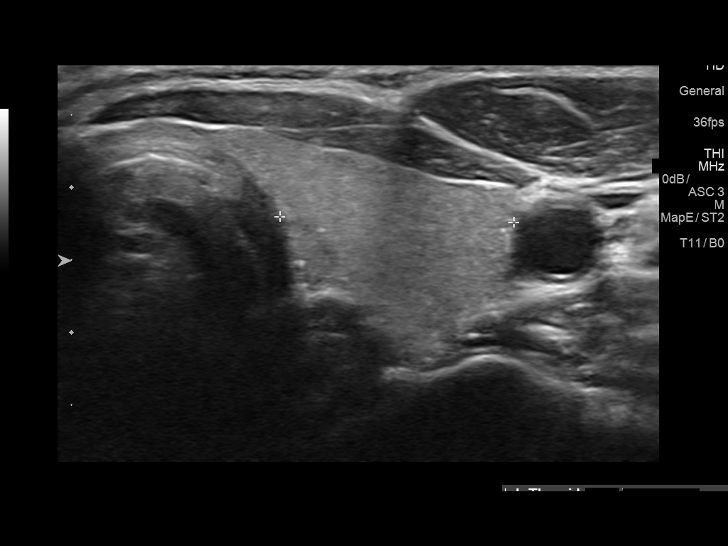
[im 22/33]
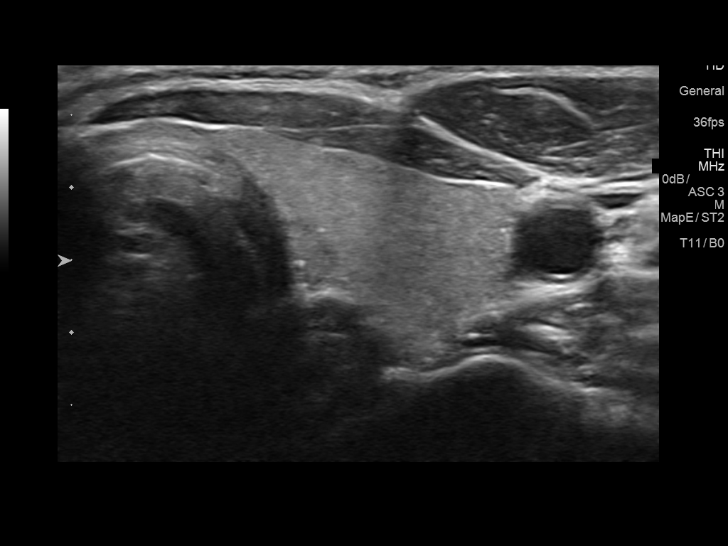
[im 25/33]
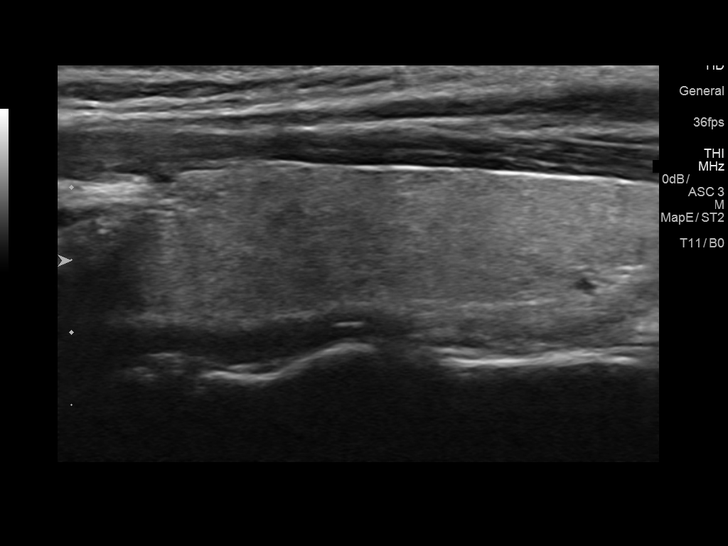
[im 27/33]
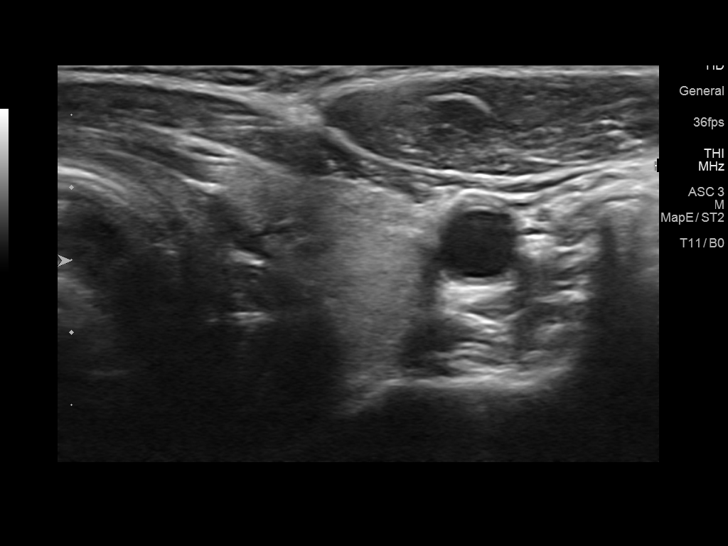
[im 30/33]
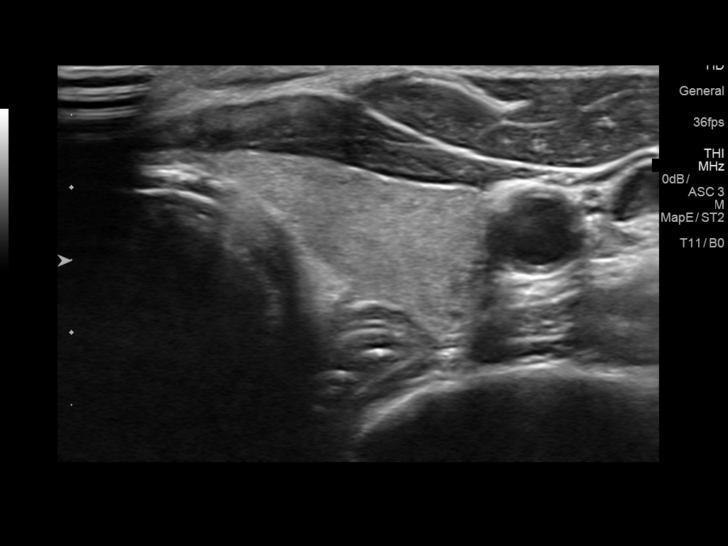
[im 33/33]
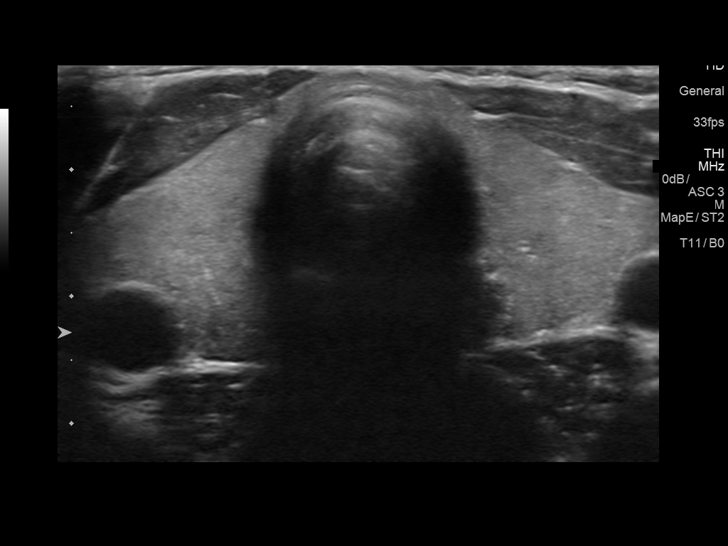

[14 of 25 positions shown; findings below may reference images not displayed]

FINDINGS: Parenchymal Echotexture: Normal

Isthmus: 2 mm

Right lobe: 4.6 x 1.7 x 1.7 cm

Left lobe: 4.3 x 1.3 x 1.6 cm

_________________________________________________________

Estimated total number of nodules >/= 1 cm: 0

Number of spongiform nodules >/=  2 cm not described below (TR1): 0

Number of mixed cystic and solid nodules >/= 1.5 cm not described
below (TR2): 0

_________________________________________________________

No discrete nodules are seen within the thyroid gland.
IMPRESSION: Normal thyroid ultrasound

The above is in keeping with the ACR TI-RADS recommendations - [HOSPITAL] 3388;[DATE].

## 2017-09-14 DIAGNOSIS — E039 Hypothyroidism, unspecified: Secondary | ICD-10-CM | POA: Diagnosis not present

## 2017-09-15 DIAGNOSIS — M9907 Segmental and somatic dysfunction of upper extremity: Secondary | ICD-10-CM | POA: Diagnosis not present

## 2017-09-15 DIAGNOSIS — M4722 Other spondylosis with radiculopathy, cervical region: Secondary | ICD-10-CM | POA: Diagnosis not present

## 2017-09-15 DIAGNOSIS — M9901 Segmental and somatic dysfunction of cervical region: Secondary | ICD-10-CM | POA: Diagnosis not present

## 2017-09-15 DIAGNOSIS — S43421A Sprain of right rotator cuff capsule, initial encounter: Secondary | ICD-10-CM | POA: Diagnosis not present

## 2017-09-28 DIAGNOSIS — M9901 Segmental and somatic dysfunction of cervical region: Secondary | ICD-10-CM | POA: Diagnosis not present

## 2017-09-28 DIAGNOSIS — M9907 Segmental and somatic dysfunction of upper extremity: Secondary | ICD-10-CM | POA: Diagnosis not present

## 2017-09-28 DIAGNOSIS — M4722 Other spondylosis with radiculopathy, cervical region: Secondary | ICD-10-CM | POA: Diagnosis not present

## 2017-09-28 DIAGNOSIS — S43421A Sprain of right rotator cuff capsule, initial encounter: Secondary | ICD-10-CM | POA: Diagnosis not present

## 2017-09-30 DIAGNOSIS — M9901 Segmental and somatic dysfunction of cervical region: Secondary | ICD-10-CM | POA: Diagnosis not present

## 2017-09-30 DIAGNOSIS — S43421A Sprain of right rotator cuff capsule, initial encounter: Secondary | ICD-10-CM | POA: Diagnosis not present

## 2017-09-30 DIAGNOSIS — M9907 Segmental and somatic dysfunction of upper extremity: Secondary | ICD-10-CM | POA: Diagnosis not present

## 2017-09-30 DIAGNOSIS — M4722 Other spondylosis with radiculopathy, cervical region: Secondary | ICD-10-CM | POA: Diagnosis not present

## 2017-10-13 DIAGNOSIS — M9901 Segmental and somatic dysfunction of cervical region: Secondary | ICD-10-CM | POA: Diagnosis not present

## 2017-10-13 DIAGNOSIS — M4722 Other spondylosis with radiculopathy, cervical region: Secondary | ICD-10-CM | POA: Diagnosis not present

## 2017-10-13 DIAGNOSIS — M9907 Segmental and somatic dysfunction of upper extremity: Secondary | ICD-10-CM | POA: Diagnosis not present

## 2017-10-13 DIAGNOSIS — S43421A Sprain of right rotator cuff capsule, initial encounter: Secondary | ICD-10-CM | POA: Diagnosis not present

## 2017-11-10 DIAGNOSIS — M9901 Segmental and somatic dysfunction of cervical region: Secondary | ICD-10-CM | POA: Diagnosis not present

## 2017-11-10 DIAGNOSIS — S43421A Sprain of right rotator cuff capsule, initial encounter: Secondary | ICD-10-CM | POA: Diagnosis not present

## 2017-11-10 DIAGNOSIS — M9907 Segmental and somatic dysfunction of upper extremity: Secondary | ICD-10-CM | POA: Diagnosis not present

## 2017-11-10 DIAGNOSIS — M4722 Other spondylosis with radiculopathy, cervical region: Secondary | ICD-10-CM | POA: Diagnosis not present

## 2017-11-11 DIAGNOSIS — E039 Hypothyroidism, unspecified: Secondary | ICD-10-CM | POA: Diagnosis not present

## 2017-11-12 DIAGNOSIS — M4722 Other spondylosis with radiculopathy, cervical region: Secondary | ICD-10-CM | POA: Diagnosis not present

## 2017-11-12 DIAGNOSIS — M9901 Segmental and somatic dysfunction of cervical region: Secondary | ICD-10-CM | POA: Diagnosis not present

## 2017-11-12 DIAGNOSIS — S43421A Sprain of right rotator cuff capsule, initial encounter: Secondary | ICD-10-CM | POA: Diagnosis not present

## 2017-11-12 DIAGNOSIS — M9907 Segmental and somatic dysfunction of upper extremity: Secondary | ICD-10-CM | POA: Diagnosis not present

## 2017-11-16 DIAGNOSIS — M4722 Other spondylosis with radiculopathy, cervical region: Secondary | ICD-10-CM | POA: Diagnosis not present

## 2017-11-16 DIAGNOSIS — M9901 Segmental and somatic dysfunction of cervical region: Secondary | ICD-10-CM | POA: Diagnosis not present

## 2017-11-16 DIAGNOSIS — S43421A Sprain of right rotator cuff capsule, initial encounter: Secondary | ICD-10-CM | POA: Diagnosis not present

## 2017-11-16 DIAGNOSIS — M9907 Segmental and somatic dysfunction of upper extremity: Secondary | ICD-10-CM | POA: Diagnosis not present

## 2017-12-01 DIAGNOSIS — M9907 Segmental and somatic dysfunction of upper extremity: Secondary | ICD-10-CM | POA: Diagnosis not present

## 2017-12-01 DIAGNOSIS — S43421A Sprain of right rotator cuff capsule, initial encounter: Secondary | ICD-10-CM | POA: Diagnosis not present

## 2017-12-01 DIAGNOSIS — M9901 Segmental and somatic dysfunction of cervical region: Secondary | ICD-10-CM | POA: Diagnosis not present

## 2017-12-01 DIAGNOSIS — M4722 Other spondylosis with radiculopathy, cervical region: Secondary | ICD-10-CM | POA: Diagnosis not present

## 2017-12-15 DIAGNOSIS — M9901 Segmental and somatic dysfunction of cervical region: Secondary | ICD-10-CM | POA: Diagnosis not present

## 2017-12-15 DIAGNOSIS — M9907 Segmental and somatic dysfunction of upper extremity: Secondary | ICD-10-CM | POA: Diagnosis not present

## 2017-12-15 DIAGNOSIS — M4722 Other spondylosis with radiculopathy, cervical region: Secondary | ICD-10-CM | POA: Diagnosis not present

## 2017-12-15 DIAGNOSIS — S43421A Sprain of right rotator cuff capsule, initial encounter: Secondary | ICD-10-CM | POA: Diagnosis not present

## 2018-01-05 DIAGNOSIS — M9901 Segmental and somatic dysfunction of cervical region: Secondary | ICD-10-CM | POA: Diagnosis not present

## 2018-01-05 DIAGNOSIS — M9907 Segmental and somatic dysfunction of upper extremity: Secondary | ICD-10-CM | POA: Diagnosis not present

## 2018-01-05 DIAGNOSIS — S43421A Sprain of right rotator cuff capsule, initial encounter: Secondary | ICD-10-CM | POA: Diagnosis not present

## 2018-01-05 DIAGNOSIS — M4722 Other spondylosis with radiculopathy, cervical region: Secondary | ICD-10-CM | POA: Diagnosis not present

## 2018-01-13 DIAGNOSIS — N6310 Unspecified lump in the right breast, unspecified quadrant: Secondary | ICD-10-CM | POA: Diagnosis not present

## 2018-01-15 ENCOUNTER — Other Ambulatory Visit: Payer: Self-pay | Admitting: Obstetrics and Gynecology

## 2018-01-15 DIAGNOSIS — N6001 Solitary cyst of right breast: Secondary | ICD-10-CM

## 2018-01-21 DIAGNOSIS — S43421A Sprain of right rotator cuff capsule, initial encounter: Secondary | ICD-10-CM | POA: Diagnosis not present

## 2018-01-21 DIAGNOSIS — M9907 Segmental and somatic dysfunction of upper extremity: Secondary | ICD-10-CM | POA: Diagnosis not present

## 2018-01-21 DIAGNOSIS — M9901 Segmental and somatic dysfunction of cervical region: Secondary | ICD-10-CM | POA: Diagnosis not present

## 2018-01-21 DIAGNOSIS — M4722 Other spondylosis with radiculopathy, cervical region: Secondary | ICD-10-CM | POA: Diagnosis not present

## 2018-01-26 ENCOUNTER — Ambulatory Visit
Admission: RE | Admit: 2018-01-26 | Discharge: 2018-01-26 | Disposition: A | Discharge status: Home / Self Care | Payer: BLUE CROSS/BLUE SHIELD | Source: Ambulatory Visit | Attending: Obstetrics and Gynecology | Admitting: Obstetrics and Gynecology

## 2018-01-26 DIAGNOSIS — N6001 Solitary cyst of right breast: Secondary | ICD-10-CM

## 2018-01-26 DIAGNOSIS — R922 Inconclusive mammogram: Secondary | ICD-10-CM | POA: Diagnosis not present

## 2018-03-16 DIAGNOSIS — Z6824 Body mass index (BMI) 24.0-24.9, adult: Secondary | ICD-10-CM | POA: Diagnosis not present

## 2018-03-16 DIAGNOSIS — Z01419 Encounter for gynecological examination (general) (routine) without abnormal findings: Secondary | ICD-10-CM | POA: Diagnosis not present

## 2018-08-16 DIAGNOSIS — J189 Pneumonia, unspecified organism: Secondary | ICD-10-CM | POA: Diagnosis not present

## 2018-08-16 DIAGNOSIS — Z23 Encounter for immunization: Secondary | ICD-10-CM | POA: Diagnosis not present

## 2018-08-27 DIAGNOSIS — R7989 Other specified abnormal findings of blood chemistry: Secondary | ICD-10-CM | POA: Diagnosis not present

## 2018-08-27 DIAGNOSIS — Z13220 Encounter for screening for lipoid disorders: Secondary | ICD-10-CM | POA: Diagnosis not present

## 2018-08-27 DIAGNOSIS — Z0000 Encounter for general adult medical examination without abnormal findings: Secondary | ICD-10-CM | POA: Diagnosis not present

## 2018-08-27 DIAGNOSIS — E039 Hypothyroidism, unspecified: Secondary | ICD-10-CM | POA: Diagnosis not present

## 2018-08-27 DIAGNOSIS — N390 Urinary tract infection, site not specified: Secondary | ICD-10-CM | POA: Diagnosis not present

## 2018-08-27 DIAGNOSIS — E559 Vitamin D deficiency, unspecified: Secondary | ICD-10-CM | POA: Diagnosis not present

## 2018-08-31 DIAGNOSIS — M9901 Segmental and somatic dysfunction of cervical region: Secondary | ICD-10-CM | POA: Diagnosis not present

## 2018-08-31 DIAGNOSIS — M4722 Other spondylosis with radiculopathy, cervical region: Secondary | ICD-10-CM | POA: Diagnosis not present

## 2018-08-31 DIAGNOSIS — M9907 Segmental and somatic dysfunction of upper extremity: Secondary | ICD-10-CM | POA: Diagnosis not present

## 2018-08-31 DIAGNOSIS — S43421A Sprain of right rotator cuff capsule, initial encounter: Secondary | ICD-10-CM | POA: Diagnosis not present

## 2018-09-01 DIAGNOSIS — Z0000 Encounter for general adult medical examination without abnormal findings: Secondary | ICD-10-CM | POA: Diagnosis not present

## 2018-09-08 DIAGNOSIS — Z6824 Body mass index (BMI) 24.0-24.9, adult: Secondary | ICD-10-CM | POA: Diagnosis not present

## 2018-09-08 DIAGNOSIS — E039 Hypothyroidism, unspecified: Secondary | ICD-10-CM | POA: Diagnosis not present

## 2018-09-08 DIAGNOSIS — R7989 Other specified abnormal findings of blood chemistry: Secondary | ICD-10-CM | POA: Diagnosis not present

## 2018-11-03 DIAGNOSIS — R0602 Shortness of breath: Secondary | ICD-10-CM | POA: Diagnosis not present

## 2018-12-01 DIAGNOSIS — R7989 Other specified abnormal findings of blood chemistry: Secondary | ICD-10-CM | POA: Diagnosis not present

## 2019-06-06 DIAGNOSIS — E039 Hypothyroidism, unspecified: Secondary | ICD-10-CM | POA: Diagnosis not present

## 2019-06-07 DIAGNOSIS — E039 Hypothyroidism, unspecified: Secondary | ICD-10-CM | POA: Diagnosis not present

## 2019-06-09 DIAGNOSIS — R7989 Other specified abnormal findings of blood chemistry: Secondary | ICD-10-CM | POA: Diagnosis not present

## 2019-06-09 DIAGNOSIS — E039 Hypothyroidism, unspecified: Secondary | ICD-10-CM | POA: Diagnosis not present

## 2019-06-09 DIAGNOSIS — Z23 Encounter for immunization: Secondary | ICD-10-CM | POA: Diagnosis not present

## 2019-06-20 DIAGNOSIS — H0102A Squamous blepharitis right eye, upper and lower eyelids: Secondary | ICD-10-CM | POA: Diagnosis not present

## 2019-06-20 DIAGNOSIS — H0102B Squamous blepharitis left eye, upper and lower eyelids: Secondary | ICD-10-CM | POA: Diagnosis not present

## 2019-06-20 DIAGNOSIS — H00024 Hordeolum internum left upper eyelid: Secondary | ICD-10-CM | POA: Diagnosis not present

## 2019-06-29 DIAGNOSIS — H00024 Hordeolum internum left upper eyelid: Secondary | ICD-10-CM | POA: Diagnosis not present

## 2019-06-29 DIAGNOSIS — H0102A Squamous blepharitis right eye, upper and lower eyelids: Secondary | ICD-10-CM | POA: Diagnosis not present

## 2019-06-29 DIAGNOSIS — H0102B Squamous blepharitis left eye, upper and lower eyelids: Secondary | ICD-10-CM | POA: Diagnosis not present

## 2019-07-29 DIAGNOSIS — H00024 Hordeolum internum left upper eyelid: Secondary | ICD-10-CM | POA: Diagnosis not present

## 2019-07-29 DIAGNOSIS — H0102B Squamous blepharitis left eye, upper and lower eyelids: Secondary | ICD-10-CM | POA: Diagnosis not present

## 2019-07-29 DIAGNOSIS — H0102A Squamous blepharitis right eye, upper and lower eyelids: Secondary | ICD-10-CM | POA: Diagnosis not present

## 2019-09-05 DIAGNOSIS — Z13220 Encounter for screening for lipoid disorders: Secondary | ICD-10-CM | POA: Diagnosis not present

## 2019-09-05 DIAGNOSIS — D72818 Other decreased white blood cell count: Secondary | ICD-10-CM | POA: Diagnosis not present

## 2019-09-05 DIAGNOSIS — Z8639 Personal history of other endocrine, nutritional and metabolic disease: Secondary | ICD-10-CM | POA: Diagnosis not present

## 2019-09-05 DIAGNOSIS — E559 Vitamin D deficiency, unspecified: Secondary | ICD-10-CM | POA: Diagnosis not present

## 2019-09-05 DIAGNOSIS — E039 Hypothyroidism, unspecified: Secondary | ICD-10-CM | POA: Diagnosis not present

## 2019-09-05 DIAGNOSIS — R7989 Other specified abnormal findings of blood chemistry: Secondary | ICD-10-CM | POA: Diagnosis not present

## 2019-09-08 DIAGNOSIS — R0989 Other specified symptoms and signs involving the circulatory and respiratory systems: Secondary | ICD-10-CM | POA: Diagnosis not present

## 2019-09-08 DIAGNOSIS — Z23 Encounter for immunization: Secondary | ICD-10-CM | POA: Diagnosis not present

## 2019-09-08 DIAGNOSIS — Z8639 Personal history of other endocrine, nutritional and metabolic disease: Secondary | ICD-10-CM | POA: Diagnosis not present

## 2019-09-08 DIAGNOSIS — Z0000 Encounter for general adult medical examination without abnormal findings: Secondary | ICD-10-CM | POA: Diagnosis not present

## 2019-10-21 DIAGNOSIS — M25519 Pain in unspecified shoulder: Secondary | ICD-10-CM | POA: Diagnosis not present

## 2019-10-21 DIAGNOSIS — M542 Cervicalgia: Secondary | ICD-10-CM | POA: Diagnosis not present

## 2019-10-28 DIAGNOSIS — H00024 Hordeolum internum left upper eyelid: Secondary | ICD-10-CM | POA: Diagnosis not present

## 2019-10-28 DIAGNOSIS — H0102A Squamous blepharitis right eye, upper and lower eyelids: Secondary | ICD-10-CM | POA: Diagnosis not present

## 2019-10-28 DIAGNOSIS — H0102B Squamous blepharitis left eye, upper and lower eyelids: Secondary | ICD-10-CM | POA: Diagnosis not present

## 2019-11-29 DIAGNOSIS — H0102A Squamous blepharitis right eye, upper and lower eyelids: Secondary | ICD-10-CM | POA: Diagnosis not present

## 2019-11-29 DIAGNOSIS — H0102B Squamous blepharitis left eye, upper and lower eyelids: Secondary | ICD-10-CM | POA: Diagnosis not present

## 2019-12-05 DIAGNOSIS — E039 Hypothyroidism, unspecified: Secondary | ICD-10-CM | POA: Diagnosis not present

## 2019-12-09 DIAGNOSIS — Z23 Encounter for immunization: Secondary | ICD-10-CM | POA: Diagnosis not present

## 2019-12-14 DIAGNOSIS — Z6827 Body mass index (BMI) 27.0-27.9, adult: Secondary | ICD-10-CM | POA: Diagnosis not present

## 2019-12-14 DIAGNOSIS — E039 Hypothyroidism, unspecified: Secondary | ICD-10-CM | POA: Diagnosis not present

## 2019-12-14 DIAGNOSIS — R7989 Other specified abnormal findings of blood chemistry: Secondary | ICD-10-CM | POA: Diagnosis not present

## 2020-01-06 DIAGNOSIS — Z23 Encounter for immunization: Secondary | ICD-10-CM | POA: Diagnosis not present

## 2020-01-23 DIAGNOSIS — L718 Other rosacea: Secondary | ICD-10-CM | POA: Diagnosis not present

## 2020-02-24 DIAGNOSIS — H0102B Squamous blepharitis left eye, upper and lower eyelids: Secondary | ICD-10-CM | POA: Diagnosis not present

## 2020-02-24 DIAGNOSIS — H0102A Squamous blepharitis right eye, upper and lower eyelids: Secondary | ICD-10-CM | POA: Diagnosis not present

## 2020-03-05 DIAGNOSIS — M47812 Spondylosis without myelopathy or radiculopathy, cervical region: Secondary | ICD-10-CM | POA: Diagnosis not present

## 2020-03-05 DIAGNOSIS — M9901 Segmental and somatic dysfunction of cervical region: Secondary | ICD-10-CM | POA: Diagnosis not present

## 2020-03-05 DIAGNOSIS — M9904 Segmental and somatic dysfunction of sacral region: Secondary | ICD-10-CM | POA: Diagnosis not present

## 2020-03-05 DIAGNOSIS — M9902 Segmental and somatic dysfunction of thoracic region: Secondary | ICD-10-CM | POA: Diagnosis not present

## 2020-03-06 DIAGNOSIS — M47812 Spondylosis without myelopathy or radiculopathy, cervical region: Secondary | ICD-10-CM | POA: Diagnosis not present

## 2020-03-06 DIAGNOSIS — M9904 Segmental and somatic dysfunction of sacral region: Secondary | ICD-10-CM | POA: Diagnosis not present

## 2020-03-06 DIAGNOSIS — M9901 Segmental and somatic dysfunction of cervical region: Secondary | ICD-10-CM | POA: Diagnosis not present

## 2020-03-06 DIAGNOSIS — M9902 Segmental and somatic dysfunction of thoracic region: Secondary | ICD-10-CM | POA: Diagnosis not present

## 2020-03-07 DIAGNOSIS — M47812 Spondylosis without myelopathy or radiculopathy, cervical region: Secondary | ICD-10-CM | POA: Diagnosis not present

## 2020-03-07 DIAGNOSIS — M9901 Segmental and somatic dysfunction of cervical region: Secondary | ICD-10-CM | POA: Diagnosis not present

## 2020-03-07 DIAGNOSIS — M9902 Segmental and somatic dysfunction of thoracic region: Secondary | ICD-10-CM | POA: Diagnosis not present

## 2020-03-07 DIAGNOSIS — M9904 Segmental and somatic dysfunction of sacral region: Secondary | ICD-10-CM | POA: Diagnosis not present

## 2020-03-12 DIAGNOSIS — M9901 Segmental and somatic dysfunction of cervical region: Secondary | ICD-10-CM | POA: Diagnosis not present

## 2020-03-12 DIAGNOSIS — M9902 Segmental and somatic dysfunction of thoracic region: Secondary | ICD-10-CM | POA: Diagnosis not present

## 2020-03-12 DIAGNOSIS — M9904 Segmental and somatic dysfunction of sacral region: Secondary | ICD-10-CM | POA: Diagnosis not present

## 2020-03-12 DIAGNOSIS — M47812 Spondylosis without myelopathy or radiculopathy, cervical region: Secondary | ICD-10-CM | POA: Diagnosis not present

## 2020-03-14 DIAGNOSIS — M9901 Segmental and somatic dysfunction of cervical region: Secondary | ICD-10-CM | POA: Diagnosis not present

## 2020-03-14 DIAGNOSIS — M9902 Segmental and somatic dysfunction of thoracic region: Secondary | ICD-10-CM | POA: Diagnosis not present

## 2020-03-14 DIAGNOSIS — M9904 Segmental and somatic dysfunction of sacral region: Secondary | ICD-10-CM | POA: Diagnosis not present

## 2020-03-14 DIAGNOSIS — M47812 Spondylosis without myelopathy or radiculopathy, cervical region: Secondary | ICD-10-CM | POA: Diagnosis not present

## 2020-04-04 DIAGNOSIS — M9901 Segmental and somatic dysfunction of cervical region: Secondary | ICD-10-CM | POA: Diagnosis not present

## 2020-04-04 DIAGNOSIS — M47812 Spondylosis without myelopathy or radiculopathy, cervical region: Secondary | ICD-10-CM | POA: Diagnosis not present

## 2020-04-04 DIAGNOSIS — M9902 Segmental and somatic dysfunction of thoracic region: Secondary | ICD-10-CM | POA: Diagnosis not present

## 2020-04-04 DIAGNOSIS — M9904 Segmental and somatic dysfunction of sacral region: Secondary | ICD-10-CM | POA: Diagnosis not present

## 2020-04-09 DIAGNOSIS — M9902 Segmental and somatic dysfunction of thoracic region: Secondary | ICD-10-CM | POA: Diagnosis not present

## 2020-04-09 DIAGNOSIS — M47812 Spondylosis without myelopathy or radiculopathy, cervical region: Secondary | ICD-10-CM | POA: Diagnosis not present

## 2020-04-09 DIAGNOSIS — M9904 Segmental and somatic dysfunction of sacral region: Secondary | ICD-10-CM | POA: Diagnosis not present

## 2020-04-09 DIAGNOSIS — M9901 Segmental and somatic dysfunction of cervical region: Secondary | ICD-10-CM | POA: Diagnosis not present

## 2020-04-17 DIAGNOSIS — M9904 Segmental and somatic dysfunction of sacral region: Secondary | ICD-10-CM | POA: Diagnosis not present

## 2020-04-17 DIAGNOSIS — M9902 Segmental and somatic dysfunction of thoracic region: Secondary | ICD-10-CM | POA: Diagnosis not present

## 2020-04-17 DIAGNOSIS — M9901 Segmental and somatic dysfunction of cervical region: Secondary | ICD-10-CM | POA: Diagnosis not present

## 2020-04-17 DIAGNOSIS — M47812 Spondylosis without myelopathy or radiculopathy, cervical region: Secondary | ICD-10-CM | POA: Diagnosis not present

## 2020-04-24 DIAGNOSIS — M9901 Segmental and somatic dysfunction of cervical region: Secondary | ICD-10-CM | POA: Diagnosis not present

## 2020-04-24 DIAGNOSIS — M9902 Segmental and somatic dysfunction of thoracic region: Secondary | ICD-10-CM | POA: Diagnosis not present

## 2020-04-24 DIAGNOSIS — M9904 Segmental and somatic dysfunction of sacral region: Secondary | ICD-10-CM | POA: Diagnosis not present

## 2020-04-24 DIAGNOSIS — M47812 Spondylosis without myelopathy or radiculopathy, cervical region: Secondary | ICD-10-CM | POA: Diagnosis not present

## 2020-05-01 DIAGNOSIS — M9901 Segmental and somatic dysfunction of cervical region: Secondary | ICD-10-CM | POA: Diagnosis not present

## 2020-05-01 DIAGNOSIS — M47812 Spondylosis without myelopathy or radiculopathy, cervical region: Secondary | ICD-10-CM | POA: Diagnosis not present

## 2020-05-01 DIAGNOSIS — M9904 Segmental and somatic dysfunction of sacral region: Secondary | ICD-10-CM | POA: Diagnosis not present

## 2020-05-01 DIAGNOSIS — M9902 Segmental and somatic dysfunction of thoracic region: Secondary | ICD-10-CM | POA: Diagnosis not present

## 2020-05-09 DIAGNOSIS — M9901 Segmental and somatic dysfunction of cervical region: Secondary | ICD-10-CM | POA: Diagnosis not present

## 2020-05-09 DIAGNOSIS — M9904 Segmental and somatic dysfunction of sacral region: Secondary | ICD-10-CM | POA: Diagnosis not present

## 2020-05-09 DIAGNOSIS — M9902 Segmental and somatic dysfunction of thoracic region: Secondary | ICD-10-CM | POA: Diagnosis not present

## 2020-05-09 DIAGNOSIS — M47812 Spondylosis without myelopathy or radiculopathy, cervical region: Secondary | ICD-10-CM | POA: Diagnosis not present

## 2020-05-16 DIAGNOSIS — M9902 Segmental and somatic dysfunction of thoracic region: Secondary | ICD-10-CM | POA: Diagnosis not present

## 2020-05-16 DIAGNOSIS — M47812 Spondylosis without myelopathy or radiculopathy, cervical region: Secondary | ICD-10-CM | POA: Diagnosis not present

## 2020-05-16 DIAGNOSIS — M9904 Segmental and somatic dysfunction of sacral region: Secondary | ICD-10-CM | POA: Diagnosis not present

## 2020-05-16 DIAGNOSIS — M9901 Segmental and somatic dysfunction of cervical region: Secondary | ICD-10-CM | POA: Diagnosis not present

## 2020-05-22 DIAGNOSIS — M9904 Segmental and somatic dysfunction of sacral region: Secondary | ICD-10-CM | POA: Diagnosis not present

## 2020-05-22 DIAGNOSIS — M47812 Spondylosis without myelopathy or radiculopathy, cervical region: Secondary | ICD-10-CM | POA: Diagnosis not present

## 2020-05-22 DIAGNOSIS — M9901 Segmental and somatic dysfunction of cervical region: Secondary | ICD-10-CM | POA: Diagnosis not present

## 2020-05-22 DIAGNOSIS — M9902 Segmental and somatic dysfunction of thoracic region: Secondary | ICD-10-CM | POA: Diagnosis not present

## 2020-05-29 DIAGNOSIS — M9901 Segmental and somatic dysfunction of cervical region: Secondary | ICD-10-CM | POA: Diagnosis not present

## 2020-05-29 DIAGNOSIS — M9902 Segmental and somatic dysfunction of thoracic region: Secondary | ICD-10-CM | POA: Diagnosis not present

## 2020-05-29 DIAGNOSIS — M9904 Segmental and somatic dysfunction of sacral region: Secondary | ICD-10-CM | POA: Diagnosis not present

## 2020-05-29 DIAGNOSIS — M47812 Spondylosis without myelopathy or radiculopathy, cervical region: Secondary | ICD-10-CM | POA: Diagnosis not present

## 2020-06-21 DIAGNOSIS — E039 Hypothyroidism, unspecified: Secondary | ICD-10-CM | POA: Diagnosis not present

## 2020-06-27 DIAGNOSIS — Z23 Encounter for immunization: Secondary | ICD-10-CM | POA: Diagnosis not present

## 2020-06-27 DIAGNOSIS — R7989 Other specified abnormal findings of blood chemistry: Secondary | ICD-10-CM | POA: Diagnosis not present

## 2020-06-27 DIAGNOSIS — E039 Hypothyroidism, unspecified: Secondary | ICD-10-CM | POA: Diagnosis not present

## 2020-07-19 DIAGNOSIS — L817 Pigmented purpuric dermatosis: Secondary | ICD-10-CM | POA: Diagnosis not present

## 2020-07-19 DIAGNOSIS — L718 Other rosacea: Secondary | ICD-10-CM | POA: Diagnosis not present

## 2020-09-10 DIAGNOSIS — Z13220 Encounter for screening for lipoid disorders: Secondary | ICD-10-CM | POA: Diagnosis not present

## 2020-09-10 DIAGNOSIS — Z0000 Encounter for general adult medical examination without abnormal findings: Secondary | ICD-10-CM | POA: Diagnosis not present

## 2020-09-10 DIAGNOSIS — E039 Hypothyroidism, unspecified: Secondary | ICD-10-CM | POA: Diagnosis not present

## 2020-09-13 DIAGNOSIS — Z0000 Encounter for general adult medical examination without abnormal findings: Secondary | ICD-10-CM | POA: Diagnosis not present

## 2020-09-13 DIAGNOSIS — E039 Hypothyroidism, unspecified: Secondary | ICD-10-CM | POA: Diagnosis not present

## 2020-10-24 ENCOUNTER — Other Ambulatory Visit: Payer: Self-pay | Admitting: Obstetrics and Gynecology

## 2020-10-24 DIAGNOSIS — Z1231 Encounter for screening mammogram for malignant neoplasm of breast: Secondary | ICD-10-CM

## 2020-11-07 DIAGNOSIS — F411 Generalized anxiety disorder: Secondary | ICD-10-CM | POA: Diagnosis not present

## 2020-11-15 DIAGNOSIS — F411 Generalized anxiety disorder: Secondary | ICD-10-CM | POA: Diagnosis not present

## 2020-11-22 DIAGNOSIS — F411 Generalized anxiety disorder: Secondary | ICD-10-CM | POA: Diagnosis not present

## 2020-11-28 DIAGNOSIS — D2261 Melanocytic nevi of right upper limb, including shoulder: Secondary | ICD-10-CM | POA: Diagnosis not present

## 2020-11-28 DIAGNOSIS — D485 Neoplasm of uncertain behavior of skin: Secondary | ICD-10-CM | POA: Diagnosis not present

## 2020-11-28 DIAGNOSIS — F4320 Adjustment disorder, unspecified: Secondary | ICD-10-CM | POA: Diagnosis not present

## 2020-11-28 DIAGNOSIS — D2271 Melanocytic nevi of right lower limb, including hip: Secondary | ICD-10-CM | POA: Diagnosis not present

## 2020-11-28 DIAGNOSIS — L814 Other melanin hyperpigmentation: Secondary | ICD-10-CM | POA: Diagnosis not present

## 2020-11-28 DIAGNOSIS — D2262 Melanocytic nevi of left upper limb, including shoulder: Secondary | ICD-10-CM | POA: Diagnosis not present

## 2020-11-28 DIAGNOSIS — D225 Melanocytic nevi of trunk: Secondary | ICD-10-CM | POA: Diagnosis not present

## 2020-12-05 DIAGNOSIS — F4320 Adjustment disorder, unspecified: Secondary | ICD-10-CM | POA: Diagnosis not present

## 2020-12-12 DIAGNOSIS — F4320 Adjustment disorder, unspecified: Secondary | ICD-10-CM | POA: Diagnosis not present

## 2020-12-18 ENCOUNTER — Ambulatory Visit
Admission: RE | Admit: 2020-12-18 | Discharge: 2020-12-18 | Disposition: A | Discharge status: Home / Self Care | Payer: BC Managed Care – PPO | Source: Ambulatory Visit | Attending: Obstetrics and Gynecology | Admitting: Obstetrics and Gynecology

## 2020-12-18 ENCOUNTER — Other Ambulatory Visit: Payer: Self-pay

## 2020-12-18 DIAGNOSIS — Z1231 Encounter for screening mammogram for malignant neoplasm of breast: Secondary | ICD-10-CM

## 2020-12-19 DIAGNOSIS — F4320 Adjustment disorder, unspecified: Secondary | ICD-10-CM | POA: Diagnosis not present

## 2020-12-26 DIAGNOSIS — I7300 Raynaud's syndrome without gangrene: Secondary | ICD-10-CM | POA: Diagnosis not present

## 2020-12-28 DIAGNOSIS — F4320 Adjustment disorder, unspecified: Secondary | ICD-10-CM | POA: Diagnosis not present

## 2021-01-08 DIAGNOSIS — F4320 Adjustment disorder, unspecified: Secondary | ICD-10-CM | POA: Diagnosis not present

## 2021-01-15 DIAGNOSIS — F4320 Adjustment disorder, unspecified: Secondary | ICD-10-CM | POA: Diagnosis not present

## 2021-01-16 DIAGNOSIS — R768 Other specified abnormal immunological findings in serum: Secondary | ICD-10-CM | POA: Diagnosis not present

## 2021-01-22 DIAGNOSIS — F4320 Adjustment disorder, unspecified: Secondary | ICD-10-CM | POA: Diagnosis not present

## 2021-01-23 DIAGNOSIS — R768 Other specified abnormal immunological findings in serum: Secondary | ICD-10-CM | POA: Diagnosis not present

## 2021-01-29 DIAGNOSIS — F4323 Adjustment disorder with mixed anxiety and depressed mood: Secondary | ICD-10-CM | POA: Diagnosis not present

## 2021-02-05 DIAGNOSIS — F4323 Adjustment disorder with mixed anxiety and depressed mood: Secondary | ICD-10-CM | POA: Diagnosis not present

## 2021-02-21 DIAGNOSIS — F4323 Adjustment disorder with mixed anxiety and depressed mood: Secondary | ICD-10-CM | POA: Diagnosis not present

## 2021-02-28 DIAGNOSIS — E039 Hypothyroidism, unspecified: Secondary | ICD-10-CM | POA: Diagnosis not present

## 2021-03-06 DIAGNOSIS — U071 COVID-19: Secondary | ICD-10-CM | POA: Diagnosis not present

## 2021-03-06 DIAGNOSIS — R059 Cough, unspecified: Secondary | ICD-10-CM | POA: Diagnosis not present

## 2021-03-06 DIAGNOSIS — J0110 Acute frontal sinusitis, unspecified: Secondary | ICD-10-CM | POA: Diagnosis not present

## 2021-04-09 DIAGNOSIS — H9202 Otalgia, left ear: Secondary | ICD-10-CM | POA: Diagnosis not present

## 2021-04-09 DIAGNOSIS — Z8616 Personal history of COVID-19: Secondary | ICD-10-CM | POA: Diagnosis not present

## 2021-04-09 DIAGNOSIS — H6121 Impacted cerumen, right ear: Secondary | ICD-10-CM | POA: Diagnosis not present

## 2021-04-09 DIAGNOSIS — T732XXA Exhaustion due to exposure, initial encounter: Secondary | ICD-10-CM | POA: Diagnosis not present

## 2021-04-09 DIAGNOSIS — R52 Pain, unspecified: Secondary | ICD-10-CM | POA: Diagnosis not present

## 2021-04-10 DIAGNOSIS — F4323 Adjustment disorder with mixed anxiety and depressed mood: Secondary | ICD-10-CM | POA: Diagnosis not present

## 2021-04-29 DIAGNOSIS — R7989 Other specified abnormal findings of blood chemistry: Secondary | ICD-10-CM | POA: Diagnosis not present

## 2021-04-29 DIAGNOSIS — Z6829 Body mass index (BMI) 29.0-29.9, adult: Secondary | ICD-10-CM | POA: Diagnosis not present

## 2021-04-29 DIAGNOSIS — E039 Hypothyroidism, unspecified: Secondary | ICD-10-CM | POA: Diagnosis not present

## 2021-05-08 DIAGNOSIS — F4323 Adjustment disorder with mixed anxiety and depressed mood: Secondary | ICD-10-CM | POA: Diagnosis not present

## 2021-05-22 DIAGNOSIS — F4323 Adjustment disorder with mixed anxiety and depressed mood: Secondary | ICD-10-CM | POA: Diagnosis not present

## 2021-06-05 DIAGNOSIS — F4323 Adjustment disorder with mixed anxiety and depressed mood: Secondary | ICD-10-CM | POA: Diagnosis not present

## 2021-06-18 DIAGNOSIS — F4323 Adjustment disorder with mixed anxiety and depressed mood: Secondary | ICD-10-CM | POA: Diagnosis not present

## 2021-07-06 DIAGNOSIS — F4323 Adjustment disorder with mixed anxiety and depressed mood: Secondary | ICD-10-CM | POA: Diagnosis not present

## 2021-07-17 DIAGNOSIS — M5441 Lumbago with sciatica, right side: Secondary | ICD-10-CM | POA: Diagnosis not present

## 2021-07-17 DIAGNOSIS — M47812 Spondylosis without myelopathy or radiculopathy, cervical region: Secondary | ICD-10-CM | POA: Diagnosis not present

## 2021-07-17 DIAGNOSIS — M9901 Segmental and somatic dysfunction of cervical region: Secondary | ICD-10-CM | POA: Diagnosis not present

## 2021-07-17 DIAGNOSIS — F4323 Adjustment disorder with mixed anxiety and depressed mood: Secondary | ICD-10-CM | POA: Diagnosis not present

## 2021-07-17 DIAGNOSIS — M9903 Segmental and somatic dysfunction of lumbar region: Secondary | ICD-10-CM | POA: Diagnosis not present

## 2021-07-17 DIAGNOSIS — S83411A Sprain of medial collateral ligament of right knee, initial encounter: Secondary | ICD-10-CM | POA: Diagnosis not present

## 2021-07-17 DIAGNOSIS — M9906 Segmental and somatic dysfunction of lower extremity: Secondary | ICD-10-CM | POA: Diagnosis not present

## 2021-07-18 DIAGNOSIS — S83411A Sprain of medial collateral ligament of right knee, initial encounter: Secondary | ICD-10-CM | POA: Diagnosis not present

## 2021-07-18 DIAGNOSIS — M9903 Segmental and somatic dysfunction of lumbar region: Secondary | ICD-10-CM | POA: Diagnosis not present

## 2021-07-18 DIAGNOSIS — M5441 Lumbago with sciatica, right side: Secondary | ICD-10-CM | POA: Diagnosis not present

## 2021-07-18 DIAGNOSIS — M9906 Segmental and somatic dysfunction of lower extremity: Secondary | ICD-10-CM | POA: Diagnosis not present

## 2021-07-18 DIAGNOSIS — M9901 Segmental and somatic dysfunction of cervical region: Secondary | ICD-10-CM | POA: Diagnosis not present

## 2021-07-18 DIAGNOSIS — M47812 Spondylosis without myelopathy or radiculopathy, cervical region: Secondary | ICD-10-CM | POA: Diagnosis not present

## 2021-07-22 DIAGNOSIS — S83411A Sprain of medial collateral ligament of right knee, initial encounter: Secondary | ICD-10-CM | POA: Diagnosis not present

## 2021-07-22 DIAGNOSIS — M9906 Segmental and somatic dysfunction of lower extremity: Secondary | ICD-10-CM | POA: Diagnosis not present

## 2021-07-22 DIAGNOSIS — M9901 Segmental and somatic dysfunction of cervical region: Secondary | ICD-10-CM | POA: Diagnosis not present

## 2021-07-22 DIAGNOSIS — M9903 Segmental and somatic dysfunction of lumbar region: Secondary | ICD-10-CM | POA: Diagnosis not present

## 2021-07-22 DIAGNOSIS — M47812 Spondylosis without myelopathy or radiculopathy, cervical region: Secondary | ICD-10-CM | POA: Diagnosis not present

## 2021-07-22 DIAGNOSIS — M5441 Lumbago with sciatica, right side: Secondary | ICD-10-CM | POA: Diagnosis not present

## 2021-07-24 DIAGNOSIS — M9903 Segmental and somatic dysfunction of lumbar region: Secondary | ICD-10-CM | POA: Diagnosis not present

## 2021-07-24 DIAGNOSIS — M5441 Lumbago with sciatica, right side: Secondary | ICD-10-CM | POA: Diagnosis not present

## 2021-07-24 DIAGNOSIS — M47812 Spondylosis without myelopathy or radiculopathy, cervical region: Secondary | ICD-10-CM | POA: Diagnosis not present

## 2021-07-24 DIAGNOSIS — L819 Disorder of pigmentation, unspecified: Secondary | ICD-10-CM | POA: Diagnosis not present

## 2021-07-24 DIAGNOSIS — S83411A Sprain of medial collateral ligament of right knee, initial encounter: Secondary | ICD-10-CM | POA: Diagnosis not present

## 2021-07-24 DIAGNOSIS — R768 Other specified abnormal immunological findings in serum: Secondary | ICD-10-CM | POA: Diagnosis not present

## 2021-07-24 DIAGNOSIS — M9901 Segmental and somatic dysfunction of cervical region: Secondary | ICD-10-CM | POA: Diagnosis not present

## 2021-07-24 DIAGNOSIS — M549 Dorsalgia, unspecified: Secondary | ICD-10-CM | POA: Diagnosis not present

## 2021-07-24 DIAGNOSIS — M9906 Segmental and somatic dysfunction of lower extremity: Secondary | ICD-10-CM | POA: Diagnosis not present

## 2021-07-25 DIAGNOSIS — M549 Dorsalgia, unspecified: Secondary | ICD-10-CM | POA: Diagnosis not present

## 2021-07-27 DIAGNOSIS — M9903 Segmental and somatic dysfunction of lumbar region: Secondary | ICD-10-CM | POA: Diagnosis not present

## 2021-07-27 DIAGNOSIS — S83411A Sprain of medial collateral ligament of right knee, initial encounter: Secondary | ICD-10-CM | POA: Diagnosis not present

## 2021-07-27 DIAGNOSIS — M9901 Segmental and somatic dysfunction of cervical region: Secondary | ICD-10-CM | POA: Diagnosis not present

## 2021-07-27 DIAGNOSIS — M5441 Lumbago with sciatica, right side: Secondary | ICD-10-CM | POA: Diagnosis not present

## 2021-07-27 DIAGNOSIS — M9906 Segmental and somatic dysfunction of lower extremity: Secondary | ICD-10-CM | POA: Diagnosis not present

## 2021-07-27 DIAGNOSIS — M47812 Spondylosis without myelopathy or radiculopathy, cervical region: Secondary | ICD-10-CM | POA: Diagnosis not present

## 2021-07-29 DIAGNOSIS — M545 Low back pain, unspecified: Secondary | ICD-10-CM | POA: Diagnosis not present

## 2021-07-29 DIAGNOSIS — M9901 Segmental and somatic dysfunction of cervical region: Secondary | ICD-10-CM | POA: Diagnosis not present

## 2021-07-29 DIAGNOSIS — M9903 Segmental and somatic dysfunction of lumbar region: Secondary | ICD-10-CM | POA: Diagnosis not present

## 2021-07-29 DIAGNOSIS — S83411A Sprain of medial collateral ligament of right knee, initial encounter: Secondary | ICD-10-CM | POA: Diagnosis not present

## 2021-07-29 DIAGNOSIS — M9906 Segmental and somatic dysfunction of lower extremity: Secondary | ICD-10-CM | POA: Diagnosis not present

## 2021-07-29 DIAGNOSIS — M47812 Spondylosis without myelopathy or radiculopathy, cervical region: Secondary | ICD-10-CM | POA: Diagnosis not present

## 2021-07-29 DIAGNOSIS — M5441 Lumbago with sciatica, right side: Secondary | ICD-10-CM | POA: Diagnosis not present

## 2021-07-29 DIAGNOSIS — M25561 Pain in right knee: Secondary | ICD-10-CM | POA: Diagnosis not present

## 2021-07-31 DIAGNOSIS — S83411A Sprain of medial collateral ligament of right knee, initial encounter: Secondary | ICD-10-CM | POA: Diagnosis not present

## 2021-07-31 DIAGNOSIS — M9906 Segmental and somatic dysfunction of lower extremity: Secondary | ICD-10-CM | POA: Diagnosis not present

## 2021-07-31 DIAGNOSIS — F4323 Adjustment disorder with mixed anxiety and depressed mood: Secondary | ICD-10-CM | POA: Diagnosis not present

## 2021-07-31 DIAGNOSIS — M9903 Segmental and somatic dysfunction of lumbar region: Secondary | ICD-10-CM | POA: Diagnosis not present

## 2021-07-31 DIAGNOSIS — M9901 Segmental and somatic dysfunction of cervical region: Secondary | ICD-10-CM | POA: Diagnosis not present

## 2021-07-31 DIAGNOSIS — M5441 Lumbago with sciatica, right side: Secondary | ICD-10-CM | POA: Diagnosis not present

## 2021-07-31 DIAGNOSIS — M47812 Spondylosis without myelopathy or radiculopathy, cervical region: Secondary | ICD-10-CM | POA: Diagnosis not present

## 2021-08-05 DIAGNOSIS — M9906 Segmental and somatic dysfunction of lower extremity: Secondary | ICD-10-CM | POA: Diagnosis not present

## 2021-08-05 DIAGNOSIS — M9903 Segmental and somatic dysfunction of lumbar region: Secondary | ICD-10-CM | POA: Diagnosis not present

## 2021-08-05 DIAGNOSIS — M47812 Spondylosis without myelopathy or radiculopathy, cervical region: Secondary | ICD-10-CM | POA: Diagnosis not present

## 2021-08-05 DIAGNOSIS — S83411A Sprain of medial collateral ligament of right knee, initial encounter: Secondary | ICD-10-CM | POA: Diagnosis not present

## 2021-08-05 DIAGNOSIS — M9901 Segmental and somatic dysfunction of cervical region: Secondary | ICD-10-CM | POA: Diagnosis not present

## 2021-08-05 DIAGNOSIS — M5441 Lumbago with sciatica, right side: Secondary | ICD-10-CM | POA: Diagnosis not present

## 2021-08-06 DIAGNOSIS — M545 Low back pain, unspecified: Secondary | ICD-10-CM | POA: Diagnosis not present

## 2021-08-06 DIAGNOSIS — M25561 Pain in right knee: Secondary | ICD-10-CM | POA: Diagnosis not present

## 2021-08-07 DIAGNOSIS — M9901 Segmental and somatic dysfunction of cervical region: Secondary | ICD-10-CM | POA: Diagnosis not present

## 2021-08-07 DIAGNOSIS — M5441 Lumbago with sciatica, right side: Secondary | ICD-10-CM | POA: Diagnosis not present

## 2021-08-07 DIAGNOSIS — M9903 Segmental and somatic dysfunction of lumbar region: Secondary | ICD-10-CM | POA: Diagnosis not present

## 2021-08-07 DIAGNOSIS — S83411A Sprain of medial collateral ligament of right knee, initial encounter: Secondary | ICD-10-CM | POA: Diagnosis not present

## 2021-08-07 DIAGNOSIS — M47812 Spondylosis without myelopathy or radiculopathy, cervical region: Secondary | ICD-10-CM | POA: Diagnosis not present

## 2021-08-07 DIAGNOSIS — M9906 Segmental and somatic dysfunction of lower extremity: Secondary | ICD-10-CM | POA: Diagnosis not present

## 2021-09-13 DIAGNOSIS — E039 Hypothyroidism, unspecified: Secondary | ICD-10-CM | POA: Diagnosis not present

## 2021-09-18 DIAGNOSIS — Z0000 Encounter for general adult medical examination without abnormal findings: Secondary | ICD-10-CM | POA: Diagnosis not present

## 2021-09-18 DIAGNOSIS — L03032 Cellulitis of left toe: Secondary | ICD-10-CM | POA: Diagnosis not present

## 2021-09-18 DIAGNOSIS — Z23 Encounter for immunization: Secondary | ICD-10-CM | POA: Diagnosis not present

## 2021-09-18 DIAGNOSIS — I7300 Raynaud's syndrome without gangrene: Secondary | ICD-10-CM | POA: Diagnosis not present

## 2021-09-18 DIAGNOSIS — E039 Hypothyroidism, unspecified: Secondary | ICD-10-CM | POA: Diagnosis not present

## 2021-09-25 DIAGNOSIS — M5441 Lumbago with sciatica, right side: Secondary | ICD-10-CM | POA: Diagnosis not present

## 2021-09-25 DIAGNOSIS — M9903 Segmental and somatic dysfunction of lumbar region: Secondary | ICD-10-CM | POA: Diagnosis not present

## 2021-09-25 DIAGNOSIS — M9906 Segmental and somatic dysfunction of lower extremity: Secondary | ICD-10-CM | POA: Diagnosis not present

## 2021-09-25 DIAGNOSIS — S83411A Sprain of medial collateral ligament of right knee, initial encounter: Secondary | ICD-10-CM | POA: Diagnosis not present

## 2021-09-30 DIAGNOSIS — S83411A Sprain of medial collateral ligament of right knee, initial encounter: Secondary | ICD-10-CM | POA: Diagnosis not present

## 2021-09-30 DIAGNOSIS — M5441 Lumbago with sciatica, right side: Secondary | ICD-10-CM | POA: Diagnosis not present

## 2021-09-30 DIAGNOSIS — M9903 Segmental and somatic dysfunction of lumbar region: Secondary | ICD-10-CM | POA: Diagnosis not present

## 2021-09-30 DIAGNOSIS — M9906 Segmental and somatic dysfunction of lower extremity: Secondary | ICD-10-CM | POA: Diagnosis not present

## 2021-10-02 DIAGNOSIS — M9906 Segmental and somatic dysfunction of lower extremity: Secondary | ICD-10-CM | POA: Diagnosis not present

## 2021-10-02 DIAGNOSIS — M5441 Lumbago with sciatica, right side: Secondary | ICD-10-CM | POA: Diagnosis not present

## 2021-10-02 DIAGNOSIS — M9903 Segmental and somatic dysfunction of lumbar region: Secondary | ICD-10-CM | POA: Diagnosis not present

## 2021-10-02 DIAGNOSIS — S83411A Sprain of medial collateral ligament of right knee, initial encounter: Secondary | ICD-10-CM | POA: Diagnosis not present

## 2021-10-05 DIAGNOSIS — S83411A Sprain of medial collateral ligament of right knee, initial encounter: Secondary | ICD-10-CM | POA: Diagnosis not present

## 2021-10-05 DIAGNOSIS — M9903 Segmental and somatic dysfunction of lumbar region: Secondary | ICD-10-CM | POA: Diagnosis not present

## 2021-10-05 DIAGNOSIS — M9906 Segmental and somatic dysfunction of lower extremity: Secondary | ICD-10-CM | POA: Diagnosis not present

## 2021-10-05 DIAGNOSIS — M5441 Lumbago with sciatica, right side: Secondary | ICD-10-CM | POA: Diagnosis not present

## 2021-10-07 DIAGNOSIS — F430 Acute stress reaction: Secondary | ICD-10-CM | POA: Diagnosis not present

## 2021-10-07 DIAGNOSIS — F4323 Adjustment disorder with mixed anxiety and depressed mood: Secondary | ICD-10-CM | POA: Diagnosis not present

## 2021-10-07 DIAGNOSIS — F4311 Post-traumatic stress disorder, acute: Secondary | ICD-10-CM | POA: Diagnosis not present

## 2021-10-09 DIAGNOSIS — F430 Acute stress reaction: Secondary | ICD-10-CM | POA: Diagnosis not present

## 2021-10-09 DIAGNOSIS — F4311 Post-traumatic stress disorder, acute: Secondary | ICD-10-CM | POA: Diagnosis not present

## 2021-10-09 DIAGNOSIS — F4323 Adjustment disorder with mixed anxiety and depressed mood: Secondary | ICD-10-CM | POA: Diagnosis not present

## 2021-10-10 DIAGNOSIS — M5441 Lumbago with sciatica, right side: Secondary | ICD-10-CM | POA: Diagnosis not present

## 2021-10-10 DIAGNOSIS — M9903 Segmental and somatic dysfunction of lumbar region: Secondary | ICD-10-CM | POA: Diagnosis not present

## 2021-10-10 DIAGNOSIS — M9906 Segmental and somatic dysfunction of lower extremity: Secondary | ICD-10-CM | POA: Diagnosis not present

## 2021-10-10 DIAGNOSIS — S83411A Sprain of medial collateral ligament of right knee, initial encounter: Secondary | ICD-10-CM | POA: Diagnosis not present

## 2021-10-14 DIAGNOSIS — F411 Generalized anxiety disorder: Secondary | ICD-10-CM | POA: Diagnosis not present

## 2021-10-16 DIAGNOSIS — S83411A Sprain of medial collateral ligament of right knee, initial encounter: Secondary | ICD-10-CM | POA: Diagnosis not present

## 2021-10-16 DIAGNOSIS — M9903 Segmental and somatic dysfunction of lumbar region: Secondary | ICD-10-CM | POA: Diagnosis not present

## 2021-10-16 DIAGNOSIS — M9906 Segmental and somatic dysfunction of lower extremity: Secondary | ICD-10-CM | POA: Diagnosis not present

## 2021-10-16 DIAGNOSIS — M5441 Lumbago with sciatica, right side: Secondary | ICD-10-CM | POA: Diagnosis not present

## 2021-10-16 DIAGNOSIS — F411 Generalized anxiety disorder: Secondary | ICD-10-CM | POA: Diagnosis not present

## 2021-10-17 DIAGNOSIS — F4311 Post-traumatic stress disorder, acute: Secondary | ICD-10-CM | POA: Diagnosis not present

## 2021-10-17 DIAGNOSIS — F4323 Adjustment disorder with mixed anxiety and depressed mood: Secondary | ICD-10-CM | POA: Diagnosis not present

## 2021-10-18 DIAGNOSIS — M5441 Lumbago with sciatica, right side: Secondary | ICD-10-CM | POA: Diagnosis not present

## 2021-10-18 DIAGNOSIS — M9906 Segmental and somatic dysfunction of lower extremity: Secondary | ICD-10-CM | POA: Diagnosis not present

## 2021-10-18 DIAGNOSIS — M9903 Segmental and somatic dysfunction of lumbar region: Secondary | ICD-10-CM | POA: Diagnosis not present

## 2021-10-18 DIAGNOSIS — S83411A Sprain of medial collateral ligament of right knee, initial encounter: Secondary | ICD-10-CM | POA: Diagnosis not present

## 2021-10-21 DIAGNOSIS — M9906 Segmental and somatic dysfunction of lower extremity: Secondary | ICD-10-CM | POA: Diagnosis not present

## 2021-10-21 DIAGNOSIS — M9903 Segmental and somatic dysfunction of lumbar region: Secondary | ICD-10-CM | POA: Diagnosis not present

## 2021-10-21 DIAGNOSIS — S83411A Sprain of medial collateral ligament of right knee, initial encounter: Secondary | ICD-10-CM | POA: Diagnosis not present

## 2021-10-21 DIAGNOSIS — F411 Generalized anxiety disorder: Secondary | ICD-10-CM | POA: Diagnosis not present

## 2021-10-21 DIAGNOSIS — M5441 Lumbago with sciatica, right side: Secondary | ICD-10-CM | POA: Diagnosis not present

## 2021-10-22 DIAGNOSIS — F4311 Post-traumatic stress disorder, acute: Secondary | ICD-10-CM | POA: Diagnosis not present

## 2021-10-22 DIAGNOSIS — F4323 Adjustment disorder with mixed anxiety and depressed mood: Secondary | ICD-10-CM | POA: Diagnosis not present

## 2021-10-23 DIAGNOSIS — F411 Generalized anxiety disorder: Secondary | ICD-10-CM | POA: Diagnosis not present

## 2021-10-24 DIAGNOSIS — S83411A Sprain of medial collateral ligament of right knee, initial encounter: Secondary | ICD-10-CM | POA: Diagnosis not present

## 2021-10-24 DIAGNOSIS — M5441 Lumbago with sciatica, right side: Secondary | ICD-10-CM | POA: Diagnosis not present

## 2021-10-24 DIAGNOSIS — M9906 Segmental and somatic dysfunction of lower extremity: Secondary | ICD-10-CM | POA: Diagnosis not present

## 2021-10-24 DIAGNOSIS — M9903 Segmental and somatic dysfunction of lumbar region: Secondary | ICD-10-CM | POA: Diagnosis not present

## 2021-10-28 DIAGNOSIS — F411 Generalized anxiety disorder: Secondary | ICD-10-CM | POA: Diagnosis not present

## 2021-10-29 DIAGNOSIS — F4311 Post-traumatic stress disorder, acute: Secondary | ICD-10-CM | POA: Diagnosis not present

## 2021-10-29 DIAGNOSIS — F4323 Adjustment disorder with mixed anxiety and depressed mood: Secondary | ICD-10-CM | POA: Diagnosis not present

## 2021-10-31 DIAGNOSIS — S83411A Sprain of medial collateral ligament of right knee, initial encounter: Secondary | ICD-10-CM | POA: Diagnosis not present

## 2021-10-31 DIAGNOSIS — M9903 Segmental and somatic dysfunction of lumbar region: Secondary | ICD-10-CM | POA: Diagnosis not present

## 2021-10-31 DIAGNOSIS — M5441 Lumbago with sciatica, right side: Secondary | ICD-10-CM | POA: Diagnosis not present

## 2021-10-31 DIAGNOSIS — M9906 Segmental and somatic dysfunction of lower extremity: Secondary | ICD-10-CM | POA: Diagnosis not present

## 2021-11-05 DIAGNOSIS — M9906 Segmental and somatic dysfunction of lower extremity: Secondary | ICD-10-CM | POA: Diagnosis not present

## 2021-11-05 DIAGNOSIS — F4323 Adjustment disorder with mixed anxiety and depressed mood: Secondary | ICD-10-CM | POA: Diagnosis not present

## 2021-11-05 DIAGNOSIS — F4311 Post-traumatic stress disorder, acute: Secondary | ICD-10-CM | POA: Diagnosis not present

## 2021-11-05 DIAGNOSIS — S83411A Sprain of medial collateral ligament of right knee, initial encounter: Secondary | ICD-10-CM | POA: Diagnosis not present

## 2021-11-05 DIAGNOSIS — M5441 Lumbago with sciatica, right side: Secondary | ICD-10-CM | POA: Diagnosis not present

## 2021-11-05 DIAGNOSIS — M9903 Segmental and somatic dysfunction of lumbar region: Secondary | ICD-10-CM | POA: Diagnosis not present

## 2021-11-07 DIAGNOSIS — F411 Generalized anxiety disorder: Secondary | ICD-10-CM | POA: Diagnosis not present

## 2021-11-08 DIAGNOSIS — M9903 Segmental and somatic dysfunction of lumbar region: Secondary | ICD-10-CM | POA: Diagnosis not present

## 2021-11-08 DIAGNOSIS — S83411A Sprain of medial collateral ligament of right knee, initial encounter: Secondary | ICD-10-CM | POA: Diagnosis not present

## 2021-11-08 DIAGNOSIS — M9906 Segmental and somatic dysfunction of lower extremity: Secondary | ICD-10-CM | POA: Diagnosis not present

## 2021-11-08 DIAGNOSIS — M5441 Lumbago with sciatica, right side: Secondary | ICD-10-CM | POA: Diagnosis not present

## 2021-11-13 DIAGNOSIS — F411 Generalized anxiety disorder: Secondary | ICD-10-CM | POA: Diagnosis not present

## 2021-11-14 DIAGNOSIS — M9903 Segmental and somatic dysfunction of lumbar region: Secondary | ICD-10-CM | POA: Diagnosis not present

## 2021-11-14 DIAGNOSIS — M9906 Segmental and somatic dysfunction of lower extremity: Secondary | ICD-10-CM | POA: Diagnosis not present

## 2021-11-14 DIAGNOSIS — S83411A Sprain of medial collateral ligament of right knee, initial encounter: Secondary | ICD-10-CM | POA: Diagnosis not present

## 2021-11-14 DIAGNOSIS — F4323 Adjustment disorder with mixed anxiety and depressed mood: Secondary | ICD-10-CM | POA: Diagnosis not present

## 2021-11-14 DIAGNOSIS — F4311 Post-traumatic stress disorder, acute: Secondary | ICD-10-CM | POA: Diagnosis not present

## 2021-11-14 DIAGNOSIS — M5441 Lumbago with sciatica, right side: Secondary | ICD-10-CM | POA: Diagnosis not present

## 2021-11-18 DIAGNOSIS — M9906 Segmental and somatic dysfunction of lower extremity: Secondary | ICD-10-CM | POA: Diagnosis not present

## 2021-11-18 DIAGNOSIS — M9903 Segmental and somatic dysfunction of lumbar region: Secondary | ICD-10-CM | POA: Diagnosis not present

## 2021-11-18 DIAGNOSIS — M5441 Lumbago with sciatica, right side: Secondary | ICD-10-CM | POA: Diagnosis not present

## 2021-11-18 DIAGNOSIS — S83411A Sprain of medial collateral ligament of right knee, initial encounter: Secondary | ICD-10-CM | POA: Diagnosis not present

## 2021-11-19 DIAGNOSIS — F4323 Adjustment disorder with mixed anxiety and depressed mood: Secondary | ICD-10-CM | POA: Diagnosis not present

## 2021-11-20 DIAGNOSIS — F411 Generalized anxiety disorder: Secondary | ICD-10-CM | POA: Diagnosis not present

## 2021-11-26 DIAGNOSIS — S83411A Sprain of medial collateral ligament of right knee, initial encounter: Secondary | ICD-10-CM | POA: Diagnosis not present

## 2021-11-26 DIAGNOSIS — M9906 Segmental and somatic dysfunction of lower extremity: Secondary | ICD-10-CM | POA: Diagnosis not present

## 2021-11-26 DIAGNOSIS — M5441 Lumbago with sciatica, right side: Secondary | ICD-10-CM | POA: Diagnosis not present

## 2021-11-26 DIAGNOSIS — M9903 Segmental and somatic dysfunction of lumbar region: Secondary | ICD-10-CM | POA: Diagnosis not present

## 2021-12-02 DIAGNOSIS — F411 Generalized anxiety disorder: Secondary | ICD-10-CM | POA: Diagnosis not present

## 2021-12-03 DIAGNOSIS — F4323 Adjustment disorder with mixed anxiety and depressed mood: Secondary | ICD-10-CM | POA: Diagnosis not present

## 2021-12-04 DIAGNOSIS — D2262 Melanocytic nevi of left upper limb, including shoulder: Secondary | ICD-10-CM | POA: Diagnosis not present

## 2021-12-04 DIAGNOSIS — D2271 Melanocytic nevi of right lower limb, including hip: Secondary | ICD-10-CM | POA: Diagnosis not present

## 2021-12-04 DIAGNOSIS — D2261 Melanocytic nevi of right upper limb, including shoulder: Secondary | ICD-10-CM | POA: Diagnosis not present

## 2021-12-04 DIAGNOSIS — D485 Neoplasm of uncertain behavior of skin: Secondary | ICD-10-CM | POA: Diagnosis not present

## 2021-12-04 DIAGNOSIS — D2272 Melanocytic nevi of left lower limb, including hip: Secondary | ICD-10-CM | POA: Diagnosis not present

## 2021-12-04 DIAGNOSIS — D225 Melanocytic nevi of trunk: Secondary | ICD-10-CM | POA: Diagnosis not present

## 2021-12-09 DIAGNOSIS — F411 Generalized anxiety disorder: Secondary | ICD-10-CM | POA: Diagnosis not present

## 2021-12-12 DIAGNOSIS — J3489 Other specified disorders of nose and nasal sinuses: Secondary | ICD-10-CM | POA: Diagnosis not present

## 2021-12-12 DIAGNOSIS — J0101 Acute recurrent maxillary sinusitis: Secondary | ICD-10-CM | POA: Diagnosis not present

## 2021-12-19 DIAGNOSIS — F4323 Adjustment disorder with mixed anxiety and depressed mood: Secondary | ICD-10-CM | POA: Diagnosis not present

## 2021-12-19 DIAGNOSIS — S83411A Sprain of medial collateral ligament of right knee, initial encounter: Secondary | ICD-10-CM | POA: Diagnosis not present

## 2021-12-19 DIAGNOSIS — M5441 Lumbago with sciatica, right side: Secondary | ICD-10-CM | POA: Diagnosis not present

## 2021-12-19 DIAGNOSIS — M9903 Segmental and somatic dysfunction of lumbar region: Secondary | ICD-10-CM | POA: Diagnosis not present

## 2021-12-19 DIAGNOSIS — M9906 Segmental and somatic dysfunction of lower extremity: Secondary | ICD-10-CM | POA: Diagnosis not present

## 2021-12-25 DIAGNOSIS — F411 Generalized anxiety disorder: Secondary | ICD-10-CM | POA: Diagnosis not present

## 2021-12-31 DIAGNOSIS — F4323 Adjustment disorder with mixed anxiety and depressed mood: Secondary | ICD-10-CM | POA: Diagnosis not present

## 2022-01-01 DIAGNOSIS — Z01419 Encounter for gynecological examination (general) (routine) without abnormal findings: Secondary | ICD-10-CM | POA: Diagnosis not present

## 2022-01-01 DIAGNOSIS — Z113 Encounter for screening for infections with a predominantly sexual mode of transmission: Secondary | ICD-10-CM | POA: Diagnosis not present

## 2022-01-01 DIAGNOSIS — B001 Herpesviral vesicular dermatitis: Secondary | ICD-10-CM | POA: Diagnosis not present

## 2022-01-01 DIAGNOSIS — Z124 Encounter for screening for malignant neoplasm of cervix: Secondary | ICD-10-CM | POA: Diagnosis not present

## 2022-01-01 DIAGNOSIS — Z6829 Body mass index (BMI) 29.0-29.9, adult: Secondary | ICD-10-CM | POA: Diagnosis not present

## 2022-01-03 DIAGNOSIS — M9906 Segmental and somatic dysfunction of lower extremity: Secondary | ICD-10-CM | POA: Diagnosis not present

## 2022-01-03 DIAGNOSIS — S83411A Sprain of medial collateral ligament of right knee, initial encounter: Secondary | ICD-10-CM | POA: Diagnosis not present

## 2022-01-03 DIAGNOSIS — M5441 Lumbago with sciatica, right side: Secondary | ICD-10-CM | POA: Diagnosis not present

## 2022-01-03 DIAGNOSIS — M9903 Segmental and somatic dysfunction of lumbar region: Secondary | ICD-10-CM | POA: Diagnosis not present

## 2022-01-10 DIAGNOSIS — F411 Generalized anxiety disorder: Secondary | ICD-10-CM | POA: Diagnosis not present

## 2022-01-14 DIAGNOSIS — L988 Other specified disorders of the skin and subcutaneous tissue: Secondary | ICD-10-CM | POA: Diagnosis not present

## 2022-01-14 DIAGNOSIS — F4323 Adjustment disorder with mixed anxiety and depressed mood: Secondary | ICD-10-CM | POA: Diagnosis not present

## 2022-01-14 DIAGNOSIS — D485 Neoplasm of uncertain behavior of skin: Secondary | ICD-10-CM | POA: Diagnosis not present

## 2022-01-17 DIAGNOSIS — F411 Generalized anxiety disorder: Secondary | ICD-10-CM | POA: Diagnosis not present

## 2022-01-25 DIAGNOSIS — M9906 Segmental and somatic dysfunction of lower extremity: Secondary | ICD-10-CM | POA: Diagnosis not present

## 2022-01-25 DIAGNOSIS — M5441 Lumbago with sciatica, right side: Secondary | ICD-10-CM | POA: Diagnosis not present

## 2022-01-25 DIAGNOSIS — S83411A Sprain of medial collateral ligament of right knee, initial encounter: Secondary | ICD-10-CM | POA: Diagnosis not present

## 2022-01-25 DIAGNOSIS — M9903 Segmental and somatic dysfunction of lumbar region: Secondary | ICD-10-CM | POA: Diagnosis not present

## 2022-01-29 DIAGNOSIS — F411 Generalized anxiety disorder: Secondary | ICD-10-CM | POA: Diagnosis not present

## 2022-01-30 DIAGNOSIS — F4323 Adjustment disorder with mixed anxiety and depressed mood: Secondary | ICD-10-CM | POA: Diagnosis not present

## 2022-02-06 DIAGNOSIS — M9903 Segmental and somatic dysfunction of lumbar region: Secondary | ICD-10-CM | POA: Diagnosis not present

## 2022-02-06 DIAGNOSIS — M5441 Lumbago with sciatica, right side: Secondary | ICD-10-CM | POA: Diagnosis not present

## 2022-02-06 DIAGNOSIS — M9906 Segmental and somatic dysfunction of lower extremity: Secondary | ICD-10-CM | POA: Diagnosis not present

## 2022-02-06 DIAGNOSIS — S83411A Sprain of medial collateral ligament of right knee, initial encounter: Secondary | ICD-10-CM | POA: Diagnosis not present

## 2022-02-19 DIAGNOSIS — F411 Generalized anxiety disorder: Secondary | ICD-10-CM | POA: Diagnosis not present

## 2022-02-20 DIAGNOSIS — F4323 Adjustment disorder with mixed anxiety and depressed mood: Secondary | ICD-10-CM | POA: Diagnosis not present

## 2022-03-06 DIAGNOSIS — F411 Generalized anxiety disorder: Secondary | ICD-10-CM | POA: Diagnosis not present

## 2022-03-11 DIAGNOSIS — F4323 Adjustment disorder with mixed anxiety and depressed mood: Secondary | ICD-10-CM | POA: Diagnosis not present

## 2022-03-26 DIAGNOSIS — F411 Generalized anxiety disorder: Secondary | ICD-10-CM | POA: Diagnosis not present

## 2022-04-01 DIAGNOSIS — F4323 Adjustment disorder with mixed anxiety and depressed mood: Secondary | ICD-10-CM | POA: Diagnosis not present

## 2022-04-10 DIAGNOSIS — F411 Generalized anxiety disorder: Secondary | ICD-10-CM | POA: Diagnosis not present

## 2022-04-24 DIAGNOSIS — F411 Generalized anxiety disorder: Secondary | ICD-10-CM | POA: Diagnosis not present

## 2022-04-30 DIAGNOSIS — R7989 Other specified abnormal findings of blood chemistry: Secondary | ICD-10-CM | POA: Diagnosis not present

## 2022-04-30 DIAGNOSIS — E039 Hypothyroidism, unspecified: Secondary | ICD-10-CM | POA: Diagnosis not present

## 2022-04-30 DIAGNOSIS — F4323 Adjustment disorder with mixed anxiety and depressed mood: Secondary | ICD-10-CM | POA: Diagnosis not present

## 2022-05-05 DIAGNOSIS — E039 Hypothyroidism, unspecified: Secondary | ICD-10-CM | POA: Diagnosis not present

## 2022-05-05 DIAGNOSIS — M25561 Pain in right knee: Secondary | ICD-10-CM | POA: Diagnosis not present

## 2022-05-07 DIAGNOSIS — F411 Generalized anxiety disorder: Secondary | ICD-10-CM | POA: Diagnosis not present

## 2022-05-09 DIAGNOSIS — M25561 Pain in right knee: Secondary | ICD-10-CM | POA: Diagnosis not present

## 2022-05-17 DIAGNOSIS — M2241 Chondromalacia patellae, right knee: Secondary | ICD-10-CM | POA: Diagnosis not present

## 2022-05-21 DIAGNOSIS — F411 Generalized anxiety disorder: Secondary | ICD-10-CM | POA: Diagnosis not present

## 2022-05-28 DIAGNOSIS — F4323 Adjustment disorder with mixed anxiety and depressed mood: Secondary | ICD-10-CM | POA: Diagnosis not present

## 2022-06-04 DIAGNOSIS — F411 Generalized anxiety disorder: Secondary | ICD-10-CM | POA: Diagnosis not present

## 2022-06-12 DIAGNOSIS — F4311 Post-traumatic stress disorder, acute: Secondary | ICD-10-CM | POA: Diagnosis not present

## 2022-06-16 DIAGNOSIS — F411 Generalized anxiety disorder: Secondary | ICD-10-CM | POA: Diagnosis not present

## 2022-06-30 DIAGNOSIS — F4311 Post-traumatic stress disorder, acute: Secondary | ICD-10-CM | POA: Diagnosis not present

## 2022-07-01 DIAGNOSIS — M2241 Chondromalacia patellae, right knee: Secondary | ICD-10-CM | POA: Diagnosis not present

## 2022-07-02 DIAGNOSIS — F411 Generalized anxiety disorder: Secondary | ICD-10-CM | POA: Diagnosis not present

## 2022-07-16 DIAGNOSIS — F411 Generalized anxiety disorder: Secondary | ICD-10-CM | POA: Diagnosis not present

## 2022-07-23 DIAGNOSIS — F4311 Post-traumatic stress disorder, acute: Secondary | ICD-10-CM | POA: Diagnosis not present

## 2022-07-30 DIAGNOSIS — F411 Generalized anxiety disorder: Secondary | ICD-10-CM | POA: Diagnosis not present

## 2022-08-01 DIAGNOSIS — F4311 Post-traumatic stress disorder, acute: Secondary | ICD-10-CM | POA: Diagnosis not present

## 2022-08-25 DIAGNOSIS — F411 Generalized anxiety disorder: Secondary | ICD-10-CM | POA: Diagnosis not present

## 2022-08-27 DIAGNOSIS — F4311 Post-traumatic stress disorder, acute: Secondary | ICD-10-CM | POA: Diagnosis not present

## 2022-09-03 DIAGNOSIS — F411 Generalized anxiety disorder: Secondary | ICD-10-CM | POA: Diagnosis not present

## 2022-09-17 DIAGNOSIS — F411 Generalized anxiety disorder: Secondary | ICD-10-CM | POA: Diagnosis not present

## 2022-09-24 DIAGNOSIS — F4311 Post-traumatic stress disorder, acute: Secondary | ICD-10-CM | POA: Diagnosis not present

## 2022-10-01 DIAGNOSIS — F411 Generalized anxiety disorder: Secondary | ICD-10-CM | POA: Diagnosis not present

## 2022-10-08 DIAGNOSIS — F4311 Post-traumatic stress disorder, acute: Secondary | ICD-10-CM | POA: Diagnosis not present

## 2022-10-15 DIAGNOSIS — Z0000 Encounter for general adult medical examination without abnormal findings: Secondary | ICD-10-CM | POA: Diagnosis not present

## 2022-10-15 DIAGNOSIS — F411 Generalized anxiety disorder: Secondary | ICD-10-CM | POA: Diagnosis not present

## 2022-10-15 DIAGNOSIS — E039 Hypothyroidism, unspecified: Secondary | ICD-10-CM | POA: Diagnosis not present

## 2022-10-22 ENCOUNTER — Other Ambulatory Visit (HOSPITAL_BASED_OUTPATIENT_CLINIC_OR_DEPARTMENT_OTHER): Payer: Self-pay | Admitting: Internal Medicine

## 2022-10-22 DIAGNOSIS — Z8639 Personal history of other endocrine, nutritional and metabolic disease: Secondary | ICD-10-CM | POA: Diagnosis not present

## 2022-10-22 DIAGNOSIS — Z0000 Encounter for general adult medical examination without abnormal findings: Secondary | ICD-10-CM | POA: Diagnosis not present

## 2022-10-22 DIAGNOSIS — E039 Hypothyroidism, unspecified: Secondary | ICD-10-CM | POA: Diagnosis not present

## 2022-10-22 DIAGNOSIS — L718 Other rosacea: Secondary | ICD-10-CM | POA: Diagnosis not present

## 2022-10-22 DIAGNOSIS — M79671 Pain in right foot: Secondary | ICD-10-CM | POA: Diagnosis not present

## 2022-10-22 DIAGNOSIS — I7300 Raynaud's syndrome without gangrene: Secondary | ICD-10-CM | POA: Diagnosis not present

## 2022-10-23 DIAGNOSIS — F4311 Post-traumatic stress disorder, acute: Secondary | ICD-10-CM | POA: Diagnosis not present

## 2022-10-27 ENCOUNTER — Ambulatory Visit: Payer: BC Managed Care – PPO | Admitting: Family Medicine

## 2022-10-27 VITALS — BP 104/70 | Ht 67.0 in | Wt 205.0 lb

## 2022-10-27 DIAGNOSIS — M25561 Pain in right knee: Secondary | ICD-10-CM | POA: Diagnosis not present

## 2022-10-27 DIAGNOSIS — M7741 Metatarsalgia, right foot: Secondary | ICD-10-CM

## 2022-10-27 DIAGNOSIS — G8929 Other chronic pain: Secondary | ICD-10-CM

## 2022-10-27 NOTE — Patient Instructions (Signed)
You have metatarsalgia. Metatarsal pads are important for this - let us know if you want them in different shoes - you can come in for [place] to place them. Your knee pain is due to patellofemoral syndrome. Do the exercises as directed most days of the week for the next 6 weeks. Follow up with [place] in 6 weeks otherwise.

## 2022-10-27 NOTE — Progress Notes (Cosign Needed)
PCP: Deland Pretty, MD  Subjective:   HPI: Patient is a 47 y.o. female here for right foot pain and right knee pain.  Right foot pain to the ball of her foot for the past 3 months intermittently worse with ambulation. No known injury. Does have pain with the first few steps of the day. Overall pain is improving. She does have a shoe insert. Ambulation is sometimes limited due to pain. Pain on plantar aspect.  Ongoing right knee pain for the past 1 year. Pain worse with going up stairs. 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 She has undergone PT for this. Pain is overall improving. She notices that she is unable to completely extend the knee.  Sometimes has pain in the anteromedial aspect of the knee. No known injury.  For exercise she has been using the recumbent bike.  Past Medical History:  Diagnosis Date   Back pain    Basal cell carcinoma    Dizziness    Neck pain    Vitamin D deficiency     Current Outpatient Medications on File Prior to Visit  Medication Sig Dispense Refill   Ginseng (ELEUTHERO PO) Take by mouth daily.     L-GLUTAMINE PO Take by mouth daily.     Omega-3 Fatty Acids (OMEGAPURE 780 EC PO) Take by mouth daily.     Probiotic Product (PROBIOMAX DAILY DF PO) Take by mouth daily.     UNABLE TO FIND Med Name: Enzycore     [place] Med Name: Gamma Linolenic Acid     UNABLE TO FIND Med Name: [place] Med Name: [place] Vitamin D/K2     [place] TO FIND Med Name: N-Acetylcysteine ([place])     [place] Name: Lutsen Chromium     UNABLE TO FIND Med Name: [place] [facility] Selenomethionine     No current facility-administered medications on file prior to visit.    Past Surgical History:  Procedure Laterality Date   APPENDECTOMY     BASAL CELL CARCINOMA EXCISION     BREAST CYST ASPIRATION Right 2019    Allergies  Allergen  Reactions   Penicillins Nausea And Vomiting    Social History   Socioeconomic History   Marital status: Married    Spouse name: Not on file   Number of children: 0   Years of education: Bachelors   Highest education level: Not on file  Occupational History   Occupation: Contractor/Educational  Tobacco Use   Smoking status: Never   Smokeless tobacco: Not on file  Substance and Sexual Activity   Alcohol use: Yes    Alcohol/week: 0.0 standard drinks of alcohol    Comment: 4-6 ounces of alcohol, 2-3 times weekly.   Drug use: No   Sexual activity: Not on file  Other Topics Concern   Not on file  Social History Narrative   Lives at home with husband.   Right-handed.   1-2 cups of coffee weekly.   Social Determinants of Health   Financial Resource Strain: Not on file  Food Insecurity: Not on file  Transportation Needs: Not on file  Physical Activity: Not on file  Stress: Not on file  Social Connections: Not on file  Intimate Partner Violence: Not on file    Family History  Problem Relation Age of Onset   Healthy Mother    Prostate cancer  Father    Prostate cancer Paternal Grandfather    Arthritis Maternal Grandmother    Arthritis Paternal Grandmother    Breast cancer Neg Hx     BP 104/70   Ht '5\' 7"'$  (1.702 m)   Wt 205 lb (93 kg)   BMI 32.11 kg/m   Review of Systems: See HPI above.     Objective:  Physical Exam:  Gen: awake, alert, NAD, comfortable in exam room Pulm: breathing unlabored  Right foot: No obvious swelling.  There is pes cavus deformity.  No tenderness to palpation.  Negative metatarsal squeeze test.  2+ PT and DP pulses.  Full strength with plantarflexion and dorsiflexion. FROM.  Right knee: No obvious swelling or deformity.  No tenderness to palpation.  Extension is diminished compared to the left by a few degrees.  Negative varus/valgus stress.  Negative McMurray.  Negative Thessaly.   Assessment & Plan:  1. Metatarsalgia, right Location  of pain most consistent with metatarsalgia rather than plantar fasciitis.  Atraumatic, low suspicion for stress fracture. - metatarsal pads placed  2. Patellofemoral syndrome, right History most consistent with patellofemoral syndrome.  Exam is overall reassuring. - home exercises provided  Follow-up in 6 weeks  Zola Button, MD [place], PGY-3

## 2022-10-28 ENCOUNTER — Encounter: Payer: Self-pay | Admitting: Family Medicine

## 2022-11-05 DIAGNOSIS — F4311 Post-traumatic stress disorder, acute: Secondary | ICD-10-CM | POA: Diagnosis not present

## 2022-11-12 DIAGNOSIS — F411 Generalized anxiety disorder: Secondary | ICD-10-CM | POA: Diagnosis not present

## 2022-11-20 DIAGNOSIS — D2272 Melanocytic nevi of left lower limb, including hip: Secondary | ICD-10-CM | POA: Diagnosis not present

## 2022-11-20 DIAGNOSIS — L918 Other hypertrophic disorders of the skin: Secondary | ICD-10-CM | POA: Diagnosis not present

## 2022-11-26 DIAGNOSIS — F411 Generalized anxiety disorder: Secondary | ICD-10-CM | POA: Diagnosis not present

## 2022-12-02 DIAGNOSIS — F4311 Post-traumatic stress disorder, acute: Secondary | ICD-10-CM | POA: Diagnosis not present

## 2022-12-03 ENCOUNTER — Ambulatory Visit (HOSPITAL_BASED_OUTPATIENT_CLINIC_OR_DEPARTMENT_OTHER)
Admission: RE | Admit: 2022-12-03 | Discharge: 2022-12-03 | Disposition: A | Discharge status: Home / Self Care | Payer: BC Managed Care – PPO | Source: Ambulatory Visit | Attending: Internal Medicine | Admitting: Internal Medicine

## 2022-12-03 DIAGNOSIS — E039 Hypothyroidism, unspecified: Secondary | ICD-10-CM | POA: Insufficient documentation

## 2022-12-15 ENCOUNTER — Ambulatory Visit: Payer: BC Managed Care – PPO | Admitting: Family Medicine

## 2022-12-17 DIAGNOSIS — F4311 Post-traumatic stress disorder, acute: Secondary | ICD-10-CM | POA: Diagnosis not present

## 2022-12-24 DIAGNOSIS — F411 Generalized anxiety disorder: Secondary | ICD-10-CM | POA: Diagnosis not present

## 2022-12-30 DIAGNOSIS — F4311 Post-traumatic stress disorder, acute: Secondary | ICD-10-CM | POA: Diagnosis not present

## 2023-01-14 DIAGNOSIS — F4311 Post-traumatic stress disorder, acute: Secondary | ICD-10-CM | POA: Diagnosis not present

## 2023-01-15 DIAGNOSIS — E039 Hypothyroidism, unspecified: Secondary | ICD-10-CM | POA: Diagnosis not present

## 2023-01-15 DIAGNOSIS — H938X1 Other specified disorders of right ear: Secondary | ICD-10-CM | POA: Diagnosis not present

## 2023-01-22 DIAGNOSIS — F411 Generalized anxiety disorder: Secondary | ICD-10-CM | POA: Diagnosis not present

## 2023-01-28 DIAGNOSIS — F4323 Adjustment disorder with mixed anxiety and depressed mood: Secondary | ICD-10-CM | POA: Diagnosis not present

## 2023-02-04 DIAGNOSIS — F411 Generalized anxiety disorder: Secondary | ICD-10-CM | POA: Diagnosis not present

## 2023-02-10 DIAGNOSIS — F4323 Adjustment disorder with mixed anxiety and depressed mood: Secondary | ICD-10-CM | POA: Diagnosis not present

## 2023-02-18 DIAGNOSIS — F411 Generalized anxiety disorder: Secondary | ICD-10-CM | POA: Diagnosis not present

## 2023-03-04 DIAGNOSIS — F411 Generalized anxiety disorder: Secondary | ICD-10-CM | POA: Diagnosis not present

## 2023-03-05 DIAGNOSIS — F4323 Adjustment disorder with mixed anxiety and depressed mood: Secondary | ICD-10-CM | POA: Diagnosis not present

## 2023-03-18 DIAGNOSIS — F411 Generalized anxiety disorder: Secondary | ICD-10-CM | POA: Diagnosis not present

## 2023-04-06 DIAGNOSIS — F411 Generalized anxiety disorder: Secondary | ICD-10-CM | POA: Diagnosis not present

## 2023-04-06 DIAGNOSIS — F4323 Adjustment disorder with mixed anxiety and depressed mood: Secondary | ICD-10-CM | POA: Diagnosis not present

## 2023-04-14 DIAGNOSIS — F411 Generalized anxiety disorder: Secondary | ICD-10-CM | POA: Diagnosis not present

## 2023-04-28 DIAGNOSIS — F4323 Adjustment disorder with mixed anxiety and depressed mood: Secondary | ICD-10-CM | POA: Diagnosis not present

## 2023-05-05 DIAGNOSIS — F4323 Adjustment disorder with mixed anxiety and depressed mood: Secondary | ICD-10-CM | POA: Diagnosis not present

## 2023-05-06 DIAGNOSIS — F411 Generalized anxiety disorder: Secondary | ICD-10-CM | POA: Diagnosis not present

## 2023-05-06 DIAGNOSIS — E039 Hypothyroidism, unspecified: Secondary | ICD-10-CM | POA: Diagnosis not present

## 2023-05-12 DIAGNOSIS — F4323 Adjustment disorder with mixed anxiety and depressed mood: Secondary | ICD-10-CM | POA: Diagnosis not present

## 2023-05-13 DIAGNOSIS — E039 Hypothyroidism, unspecified: Secondary | ICD-10-CM | POA: Diagnosis not present

## 2023-05-19 DIAGNOSIS — F4323 Adjustment disorder with mixed anxiety and depressed mood: Secondary | ICD-10-CM | POA: Diagnosis not present

## 2023-05-20 DIAGNOSIS — F411 Generalized anxiety disorder: Secondary | ICD-10-CM | POA: Diagnosis not present

## 2023-05-25 DIAGNOSIS — D2262 Melanocytic nevi of left upper limb, including shoulder: Secondary | ICD-10-CM | POA: Diagnosis not present

## 2023-05-25 DIAGNOSIS — D2261 Melanocytic nevi of right upper limb, including shoulder: Secondary | ICD-10-CM | POA: Diagnosis not present

## 2023-05-25 DIAGNOSIS — L814 Other melanin hyperpigmentation: Secondary | ICD-10-CM | POA: Diagnosis not present

## 2023-05-25 DIAGNOSIS — D2239 Melanocytic nevi of other parts of face: Secondary | ICD-10-CM | POA: Diagnosis not present

## 2023-06-02 DIAGNOSIS — F4323 Adjustment disorder with mixed anxiety and depressed mood: Secondary | ICD-10-CM | POA: Diagnosis not present

## 2023-06-05 DIAGNOSIS — F411 Generalized anxiety disorder: Secondary | ICD-10-CM | POA: Diagnosis not present

## 2023-06-10 DIAGNOSIS — M2241 Chondromalacia patellae, right knee: Secondary | ICD-10-CM | POA: Diagnosis not present

## 2023-06-19 DIAGNOSIS — R0989 Other specified symptoms and signs involving the circulatory and respiratory systems: Secondary | ICD-10-CM | POA: Diagnosis not present

## 2023-06-19 DIAGNOSIS — R051 Acute cough: Secondary | ICD-10-CM | POA: Diagnosis not present

## 2023-06-24 DIAGNOSIS — F411 Generalized anxiety disorder: Secondary | ICD-10-CM | POA: Diagnosis not present

## 2023-06-25 DIAGNOSIS — F4323 Adjustment disorder with mixed anxiety and depressed mood: Secondary | ICD-10-CM | POA: Diagnosis not present

## 2023-06-26 DIAGNOSIS — M2241 Chondromalacia patellae, right knee: Secondary | ICD-10-CM | POA: Diagnosis not present

## 2023-07-08 DIAGNOSIS — F411 Generalized anxiety disorder: Secondary | ICD-10-CM | POA: Diagnosis not present

## 2023-07-21 DIAGNOSIS — F4323 Adjustment disorder with mixed anxiety and depressed mood: Secondary | ICD-10-CM | POA: Diagnosis not present

## 2023-07-22 DIAGNOSIS — F411 Generalized anxiety disorder: Secondary | ICD-10-CM | POA: Diagnosis not present

## 2023-08-04 DIAGNOSIS — F4323 Adjustment disorder with mixed anxiety and depressed mood: Secondary | ICD-10-CM | POA: Diagnosis not present

## 2023-08-05 DIAGNOSIS — F411 Generalized anxiety disorder: Secondary | ICD-10-CM | POA: Diagnosis not present

## 2023-08-31 DIAGNOSIS — R051 Acute cough: Secondary | ICD-10-CM | POA: Diagnosis not present

## 2023-08-31 DIAGNOSIS — J0100 Acute maxillary sinusitis, unspecified: Secondary | ICD-10-CM | POA: Diagnosis not present

## 2023-09-16 DIAGNOSIS — F411 Generalized anxiety disorder: Secondary | ICD-10-CM | POA: Diagnosis not present

## 2023-09-30 DIAGNOSIS — F411 Generalized anxiety disorder: Secondary | ICD-10-CM | POA: Diagnosis not present

## 2023-10-23 DIAGNOSIS — E039 Hypothyroidism, unspecified: Secondary | ICD-10-CM | POA: Diagnosis not present

## 2023-10-23 DIAGNOSIS — Z8639 Personal history of other endocrine, nutritional and metabolic disease: Secondary | ICD-10-CM | POA: Diagnosis not present

## 2023-10-23 DIAGNOSIS — Z0000 Encounter for general adult medical examination without abnormal findings: Secondary | ICD-10-CM | POA: Diagnosis not present

## 2023-10-28 DIAGNOSIS — Z0000 Encounter for general adult medical examination without abnormal findings: Secondary | ICD-10-CM | POA: Diagnosis not present

## 2023-10-28 DIAGNOSIS — F411 Generalized anxiety disorder: Secondary | ICD-10-CM | POA: Diagnosis not present

## 2023-11-11 DIAGNOSIS — M9903 Segmental and somatic dysfunction of lumbar region: Secondary | ICD-10-CM | POA: Diagnosis not present

## 2023-11-11 DIAGNOSIS — M9901 Segmental and somatic dysfunction of cervical region: Secondary | ICD-10-CM | POA: Diagnosis not present

## 2023-11-11 DIAGNOSIS — M9902 Segmental and somatic dysfunction of thoracic region: Secondary | ICD-10-CM | POA: Diagnosis not present

## 2023-11-16 DIAGNOSIS — M9903 Segmental and somatic dysfunction of lumbar region: Secondary | ICD-10-CM | POA: Diagnosis not present

## 2023-11-16 DIAGNOSIS — M9901 Segmental and somatic dysfunction of cervical region: Secondary | ICD-10-CM | POA: Diagnosis not present

## 2023-11-16 DIAGNOSIS — M9902 Segmental and somatic dysfunction of thoracic region: Secondary | ICD-10-CM | POA: Diagnosis not present

## 2023-11-24 DIAGNOSIS — S233XXA Sprain of ligaments of thoracic spine, initial encounter: Secondary | ICD-10-CM | POA: Diagnosis not present

## 2023-11-24 DIAGNOSIS — S336XXA Sprain of sacroiliac joint, initial encounter: Secondary | ICD-10-CM | POA: Diagnosis not present

## 2023-11-24 DIAGNOSIS — M9902 Segmental and somatic dysfunction of thoracic region: Secondary | ICD-10-CM | POA: Diagnosis not present

## 2023-11-24 DIAGNOSIS — M9904 Segmental and somatic dysfunction of sacral region: Secondary | ICD-10-CM | POA: Diagnosis not present

## 2023-11-25 DIAGNOSIS — F411 Generalized anxiety disorder: Secondary | ICD-10-CM | POA: Diagnosis not present

## 2023-11-26 DIAGNOSIS — M9904 Segmental and somatic dysfunction of sacral region: Secondary | ICD-10-CM | POA: Diagnosis not present

## 2023-11-26 DIAGNOSIS — M9902 Segmental and somatic dysfunction of thoracic region: Secondary | ICD-10-CM | POA: Diagnosis not present

## 2023-11-26 DIAGNOSIS — S336XXA Sprain of sacroiliac joint, initial encounter: Secondary | ICD-10-CM | POA: Diagnosis not present

## 2023-11-26 DIAGNOSIS — F4323 Adjustment disorder with mixed anxiety and depressed mood: Secondary | ICD-10-CM | POA: Diagnosis not present

## 2023-11-26 DIAGNOSIS — S233XXA Sprain of ligaments of thoracic spine, initial encounter: Secondary | ICD-10-CM | POA: Diagnosis not present

## 2023-11-30 DIAGNOSIS — M9902 Segmental and somatic dysfunction of thoracic region: Secondary | ICD-10-CM | POA: Diagnosis not present

## 2023-11-30 DIAGNOSIS — S233XXA Sprain of ligaments of thoracic spine, initial encounter: Secondary | ICD-10-CM | POA: Diagnosis not present

## 2023-11-30 DIAGNOSIS — M9904 Segmental and somatic dysfunction of sacral region: Secondary | ICD-10-CM | POA: Diagnosis not present

## 2023-11-30 DIAGNOSIS — S336XXA Sprain of sacroiliac joint, initial encounter: Secondary | ICD-10-CM | POA: Diagnosis not present

## 2023-12-02 DIAGNOSIS — F4323 Adjustment disorder with mixed anxiety and depressed mood: Secondary | ICD-10-CM | POA: Diagnosis not present

## 2023-12-07 DIAGNOSIS — S233XXA Sprain of ligaments of thoracic spine, initial encounter: Secondary | ICD-10-CM | POA: Diagnosis not present

## 2023-12-07 DIAGNOSIS — M9902 Segmental and somatic dysfunction of thoracic region: Secondary | ICD-10-CM | POA: Diagnosis not present

## 2023-12-07 DIAGNOSIS — M9904 Segmental and somatic dysfunction of sacral region: Secondary | ICD-10-CM | POA: Diagnosis not present

## 2023-12-07 DIAGNOSIS — S336XXA Sprain of sacroiliac joint, initial encounter: Secondary | ICD-10-CM | POA: Diagnosis not present

## 2023-12-14 DIAGNOSIS — S233XXA Sprain of ligaments of thoracic spine, initial encounter: Secondary | ICD-10-CM | POA: Diagnosis not present

## 2023-12-14 DIAGNOSIS — M9902 Segmental and somatic dysfunction of thoracic region: Secondary | ICD-10-CM | POA: Diagnosis not present

## 2023-12-14 DIAGNOSIS — S336XXA Sprain of sacroiliac joint, initial encounter: Secondary | ICD-10-CM | POA: Diagnosis not present

## 2023-12-14 DIAGNOSIS — M9904 Segmental and somatic dysfunction of sacral region: Secondary | ICD-10-CM | POA: Diagnosis not present

## 2023-12-17 DIAGNOSIS — F4323 Adjustment disorder with mixed anxiety and depressed mood: Secondary | ICD-10-CM | POA: Diagnosis not present

## 2023-12-21 DIAGNOSIS — S336XXA Sprain of sacroiliac joint, initial encounter: Secondary | ICD-10-CM | POA: Diagnosis not present

## 2023-12-21 DIAGNOSIS — M9902 Segmental and somatic dysfunction of thoracic region: Secondary | ICD-10-CM | POA: Diagnosis not present

## 2023-12-21 DIAGNOSIS — M9904 Segmental and somatic dysfunction of sacral region: Secondary | ICD-10-CM | POA: Diagnosis not present

## 2023-12-21 DIAGNOSIS — S233XXA Sprain of ligaments of thoracic spine, initial encounter: Secondary | ICD-10-CM | POA: Diagnosis not present

## 2023-12-23 DIAGNOSIS — F411 Generalized anxiety disorder: Secondary | ICD-10-CM | POA: Diagnosis not present

## 2023-12-24 DIAGNOSIS — F4323 Adjustment disorder with mixed anxiety and depressed mood: Secondary | ICD-10-CM | POA: Diagnosis not present

## 2023-12-28 DIAGNOSIS — M9902 Segmental and somatic dysfunction of thoracic region: Secondary | ICD-10-CM | POA: Diagnosis not present

## 2023-12-28 DIAGNOSIS — M9904 Segmental and somatic dysfunction of sacral region: Secondary | ICD-10-CM | POA: Diagnosis not present

## 2023-12-28 DIAGNOSIS — S233XXA Sprain of ligaments of thoracic spine, initial encounter: Secondary | ICD-10-CM | POA: Diagnosis not present

## 2023-12-28 DIAGNOSIS — S336XXA Sprain of sacroiliac joint, initial encounter: Secondary | ICD-10-CM | POA: Diagnosis not present

## 2023-12-31 DIAGNOSIS — F4323 Adjustment disorder with mixed anxiety and depressed mood: Secondary | ICD-10-CM | POA: Diagnosis not present

## 2024-01-13 DIAGNOSIS — S336XXA Sprain of sacroiliac joint, initial encounter: Secondary | ICD-10-CM | POA: Diagnosis not present

## 2024-01-13 DIAGNOSIS — S233XXA Sprain of ligaments of thoracic spine, initial encounter: Secondary | ICD-10-CM | POA: Diagnosis not present

## 2024-01-13 DIAGNOSIS — F4323 Adjustment disorder with mixed anxiety and depressed mood: Secondary | ICD-10-CM | POA: Diagnosis not present

## 2024-01-13 DIAGNOSIS — M9902 Segmental and somatic dysfunction of thoracic region: Secondary | ICD-10-CM | POA: Diagnosis not present

## 2024-01-13 DIAGNOSIS — M9904 Segmental and somatic dysfunction of sacral region: Secondary | ICD-10-CM | POA: Diagnosis not present

## 2024-01-18 DIAGNOSIS — M9904 Segmental and somatic dysfunction of sacral region: Secondary | ICD-10-CM | POA: Diagnosis not present

## 2024-01-18 DIAGNOSIS — M9902 Segmental and somatic dysfunction of thoracic region: Secondary | ICD-10-CM | POA: Diagnosis not present

## 2024-01-18 DIAGNOSIS — S233XXA Sprain of ligaments of thoracic spine, initial encounter: Secondary | ICD-10-CM | POA: Diagnosis not present

## 2024-01-18 DIAGNOSIS — S336XXA Sprain of sacroiliac joint, initial encounter: Secondary | ICD-10-CM | POA: Diagnosis not present

## 2024-01-19 DIAGNOSIS — F4323 Adjustment disorder with mixed anxiety and depressed mood: Secondary | ICD-10-CM | POA: Diagnosis not present

## 2024-01-20 DIAGNOSIS — F411 Generalized anxiety disorder: Secondary | ICD-10-CM | POA: Diagnosis not present

## 2024-01-21 DIAGNOSIS — F4323 Adjustment disorder with mixed anxiety and depressed mood: Secondary | ICD-10-CM | POA: Diagnosis not present

## 2024-01-25 DIAGNOSIS — M9902 Segmental and somatic dysfunction of thoracic region: Secondary | ICD-10-CM | POA: Diagnosis not present

## 2024-01-25 DIAGNOSIS — S233XXA Sprain of ligaments of thoracic spine, initial encounter: Secondary | ICD-10-CM | POA: Diagnosis not present

## 2024-01-25 DIAGNOSIS — S336XXA Sprain of sacroiliac joint, initial encounter: Secondary | ICD-10-CM | POA: Diagnosis not present

## 2024-01-25 DIAGNOSIS — M9904 Segmental and somatic dysfunction of sacral region: Secondary | ICD-10-CM | POA: Diagnosis not present

## 2024-01-28 DIAGNOSIS — S336XXA Sprain of sacroiliac joint, initial encounter: Secondary | ICD-10-CM | POA: Diagnosis not present

## 2024-01-28 DIAGNOSIS — M9902 Segmental and somatic dysfunction of thoracic region: Secondary | ICD-10-CM | POA: Diagnosis not present

## 2024-01-28 DIAGNOSIS — F4323 Adjustment disorder with mixed anxiety and depressed mood: Secondary | ICD-10-CM | POA: Diagnosis not present

## 2024-01-28 DIAGNOSIS — M9904 Segmental and somatic dysfunction of sacral region: Secondary | ICD-10-CM | POA: Diagnosis not present

## 2024-01-28 DIAGNOSIS — S233XXA Sprain of ligaments of thoracic spine, initial encounter: Secondary | ICD-10-CM | POA: Diagnosis not present

## 2024-02-04 DIAGNOSIS — M9902 Segmental and somatic dysfunction of thoracic region: Secondary | ICD-10-CM | POA: Diagnosis not present

## 2024-02-04 DIAGNOSIS — M9904 Segmental and somatic dysfunction of sacral region: Secondary | ICD-10-CM | POA: Diagnosis not present

## 2024-02-04 DIAGNOSIS — S233XXA Sprain of ligaments of thoracic spine, initial encounter: Secondary | ICD-10-CM | POA: Diagnosis not present

## 2024-02-04 DIAGNOSIS — S336XXA Sprain of sacroiliac joint, initial encounter: Secondary | ICD-10-CM | POA: Diagnosis not present

## 2024-02-09 DIAGNOSIS — M9902 Segmental and somatic dysfunction of thoracic region: Secondary | ICD-10-CM | POA: Diagnosis not present

## 2024-02-09 DIAGNOSIS — M9904 Segmental and somatic dysfunction of sacral region: Secondary | ICD-10-CM | POA: Diagnosis not present

## 2024-02-09 DIAGNOSIS — F4323 Adjustment disorder with mixed anxiety and depressed mood: Secondary | ICD-10-CM | POA: Diagnosis not present

## 2024-02-09 DIAGNOSIS — S336XXA Sprain of sacroiliac joint, initial encounter: Secondary | ICD-10-CM | POA: Diagnosis not present

## 2024-02-09 DIAGNOSIS — S233XXA Sprain of ligaments of thoracic spine, initial encounter: Secondary | ICD-10-CM | POA: Diagnosis not present

## 2024-02-10 DIAGNOSIS — F4323 Adjustment disorder with mixed anxiety and depressed mood: Secondary | ICD-10-CM | POA: Diagnosis not present

## 2024-02-11 DIAGNOSIS — S233XXA Sprain of ligaments of thoracic spine, initial encounter: Secondary | ICD-10-CM | POA: Diagnosis not present

## 2024-02-11 DIAGNOSIS — M9902 Segmental and somatic dysfunction of thoracic region: Secondary | ICD-10-CM | POA: Diagnosis not present

## 2024-02-11 DIAGNOSIS — M9904 Segmental and somatic dysfunction of sacral region: Secondary | ICD-10-CM | POA: Diagnosis not present

## 2024-02-11 DIAGNOSIS — S336XXA Sprain of sacroiliac joint, initial encounter: Secondary | ICD-10-CM | POA: Diagnosis not present

## 2024-02-17 DIAGNOSIS — F411 Generalized anxiety disorder: Secondary | ICD-10-CM | POA: Diagnosis not present

## 2024-02-22 DIAGNOSIS — M9902 Segmental and somatic dysfunction of thoracic region: Secondary | ICD-10-CM | POA: Diagnosis not present

## 2024-02-22 DIAGNOSIS — S336XXA Sprain of sacroiliac joint, initial encounter: Secondary | ICD-10-CM | POA: Diagnosis not present

## 2024-02-22 DIAGNOSIS — S233XXA Sprain of ligaments of thoracic spine, initial encounter: Secondary | ICD-10-CM | POA: Diagnosis not present

## 2024-02-22 DIAGNOSIS — M9904 Segmental and somatic dysfunction of sacral region: Secondary | ICD-10-CM | POA: Diagnosis not present

## 2024-02-29 DIAGNOSIS — M9902 Segmental and somatic dysfunction of thoracic region: Secondary | ICD-10-CM | POA: Diagnosis not present

## 2024-02-29 DIAGNOSIS — M9904 Segmental and somatic dysfunction of sacral region: Secondary | ICD-10-CM | POA: Diagnosis not present

## 2024-02-29 DIAGNOSIS — S336XXA Sprain of sacroiliac joint, initial encounter: Secondary | ICD-10-CM | POA: Diagnosis not present

## 2024-02-29 DIAGNOSIS — S233XXA Sprain of ligaments of thoracic spine, initial encounter: Secondary | ICD-10-CM | POA: Diagnosis not present

## 2024-03-01 DIAGNOSIS — F4323 Adjustment disorder with mixed anxiety and depressed mood: Secondary | ICD-10-CM | POA: Diagnosis not present

## 2024-03-14 DIAGNOSIS — M9904 Segmental and somatic dysfunction of sacral region: Secondary | ICD-10-CM | POA: Diagnosis not present

## 2024-03-14 DIAGNOSIS — S233XXA Sprain of ligaments of thoracic spine, initial encounter: Secondary | ICD-10-CM | POA: Diagnosis not present

## 2024-03-14 DIAGNOSIS — H00021 Hordeolum internum right upper eyelid: Secondary | ICD-10-CM | POA: Diagnosis not present

## 2024-03-14 DIAGNOSIS — M9902 Segmental and somatic dysfunction of thoracic region: Secondary | ICD-10-CM | POA: Diagnosis not present

## 2024-03-14 DIAGNOSIS — S336XXA Sprain of sacroiliac joint, initial encounter: Secondary | ICD-10-CM | POA: Diagnosis not present

## 2024-03-16 DIAGNOSIS — F4323 Adjustment disorder with mixed anxiety and depressed mood: Secondary | ICD-10-CM | POA: Diagnosis not present

## 2024-04-04 DIAGNOSIS — M9904 Segmental and somatic dysfunction of sacral region: Secondary | ICD-10-CM | POA: Diagnosis not present

## 2024-04-04 DIAGNOSIS — M9902 Segmental and somatic dysfunction of thoracic region: Secondary | ICD-10-CM | POA: Diagnosis not present

## 2024-04-04 DIAGNOSIS — S233XXA Sprain of ligaments of thoracic spine, initial encounter: Secondary | ICD-10-CM | POA: Diagnosis not present

## 2024-04-04 DIAGNOSIS — S336XXA Sprain of sacroiliac joint, initial encounter: Secondary | ICD-10-CM | POA: Diagnosis not present

## 2024-04-05 DIAGNOSIS — F4323 Adjustment disorder with mixed anxiety and depressed mood: Secondary | ICD-10-CM | POA: Diagnosis not present

## 2024-04-06 DIAGNOSIS — F411 Generalized anxiety disorder: Secondary | ICD-10-CM | POA: Diagnosis not present

## 2024-04-11 DIAGNOSIS — M9904 Segmental and somatic dysfunction of sacral region: Secondary | ICD-10-CM | POA: Diagnosis not present

## 2024-04-11 DIAGNOSIS — S233XXA Sprain of ligaments of thoracic spine, initial encounter: Secondary | ICD-10-CM | POA: Diagnosis not present

## 2024-04-11 DIAGNOSIS — M9902 Segmental and somatic dysfunction of thoracic region: Secondary | ICD-10-CM | POA: Diagnosis not present

## 2024-04-11 DIAGNOSIS — S336XXA Sprain of sacroiliac joint, initial encounter: Secondary | ICD-10-CM | POA: Diagnosis not present

## 2024-04-25 DIAGNOSIS — S336XXA Sprain of sacroiliac joint, initial encounter: Secondary | ICD-10-CM | POA: Diagnosis not present

## 2024-04-25 DIAGNOSIS — S233XXA Sprain of ligaments of thoracic spine, initial encounter: Secondary | ICD-10-CM | POA: Diagnosis not present

## 2024-04-25 DIAGNOSIS — M9902 Segmental and somatic dysfunction of thoracic region: Secondary | ICD-10-CM | POA: Diagnosis not present

## 2024-04-25 DIAGNOSIS — M9904 Segmental and somatic dysfunction of sacral region: Secondary | ICD-10-CM | POA: Diagnosis not present

## 2024-05-02 DIAGNOSIS — M9904 Segmental and somatic dysfunction of sacral region: Secondary | ICD-10-CM | POA: Diagnosis not present

## 2024-05-02 DIAGNOSIS — S233XXA Sprain of ligaments of thoracic spine, initial encounter: Secondary | ICD-10-CM | POA: Diagnosis not present

## 2024-05-02 DIAGNOSIS — S336XXA Sprain of sacroiliac joint, initial encounter: Secondary | ICD-10-CM | POA: Diagnosis not present

## 2024-05-02 DIAGNOSIS — M9902 Segmental and somatic dysfunction of thoracic region: Secondary | ICD-10-CM | POA: Diagnosis not present

## 2024-05-03 DIAGNOSIS — F411 Generalized anxiety disorder: Secondary | ICD-10-CM | POA: Diagnosis not present

## 2024-05-05 DIAGNOSIS — F4323 Adjustment disorder with mixed anxiety and depressed mood: Secondary | ICD-10-CM | POA: Diagnosis not present

## 2024-05-09 DIAGNOSIS — M9904 Segmental and somatic dysfunction of sacral region: Secondary | ICD-10-CM | POA: Diagnosis not present

## 2024-05-09 DIAGNOSIS — S233XXA Sprain of ligaments of thoracic spine, initial encounter: Secondary | ICD-10-CM | POA: Diagnosis not present

## 2024-05-09 DIAGNOSIS — M9902 Segmental and somatic dysfunction of thoracic region: Secondary | ICD-10-CM | POA: Diagnosis not present

## 2024-05-09 DIAGNOSIS — S336XXA Sprain of sacroiliac joint, initial encounter: Secondary | ICD-10-CM | POA: Diagnosis not present

## 2024-05-12 DIAGNOSIS — E039 Hypothyroidism, unspecified: Secondary | ICD-10-CM | POA: Diagnosis not present

## 2024-05-16 DIAGNOSIS — M9902 Segmental and somatic dysfunction of thoracic region: Secondary | ICD-10-CM | POA: Diagnosis not present

## 2024-05-16 DIAGNOSIS — S336XXA Sprain of sacroiliac joint, initial encounter: Secondary | ICD-10-CM | POA: Diagnosis not present

## 2024-05-16 DIAGNOSIS — M9904 Segmental and somatic dysfunction of sacral region: Secondary | ICD-10-CM | POA: Diagnosis not present

## 2024-05-16 DIAGNOSIS — S233XXA Sprain of ligaments of thoracic spine, initial encounter: Secondary | ICD-10-CM | POA: Diagnosis not present

## 2024-05-18 DIAGNOSIS — N941 Unspecified dyspareunia: Secondary | ICD-10-CM | POA: Diagnosis not present

## 2024-05-18 DIAGNOSIS — N898 Other specified noninflammatory disorders of vagina: Secondary | ICD-10-CM | POA: Diagnosis not present

## 2024-05-18 DIAGNOSIS — R232 Flushing: Secondary | ICD-10-CM | POA: Diagnosis not present

## 2024-05-18 DIAGNOSIS — N951 Menopausal and female climacteric states: Secondary | ICD-10-CM | POA: Diagnosis not present

## 2024-05-23 DIAGNOSIS — M9902 Segmental and somatic dysfunction of thoracic region: Secondary | ICD-10-CM | POA: Diagnosis not present

## 2024-05-23 DIAGNOSIS — S233XXA Sprain of ligaments of thoracic spine, initial encounter: Secondary | ICD-10-CM | POA: Diagnosis not present

## 2024-05-23 DIAGNOSIS — M9904 Segmental and somatic dysfunction of sacral region: Secondary | ICD-10-CM | POA: Diagnosis not present

## 2024-05-23 DIAGNOSIS — S336XXA Sprain of sacroiliac joint, initial encounter: Secondary | ICD-10-CM | POA: Diagnosis not present

## 2024-05-25 DIAGNOSIS — D2261 Melanocytic nevi of right upper limb, including shoulder: Secondary | ICD-10-CM | POA: Diagnosis not present

## 2024-05-25 DIAGNOSIS — D225 Melanocytic nevi of trunk: Secondary | ICD-10-CM | POA: Diagnosis not present

## 2024-05-25 DIAGNOSIS — D2262 Melanocytic nevi of left upper limb, including shoulder: Secondary | ICD-10-CM | POA: Diagnosis not present

## 2024-05-25 DIAGNOSIS — D2239 Melanocytic nevi of other parts of face: Secondary | ICD-10-CM | POA: Diagnosis not present

## 2024-05-30 DIAGNOSIS — M9904 Segmental and somatic dysfunction of sacral region: Secondary | ICD-10-CM | POA: Diagnosis not present

## 2024-05-30 DIAGNOSIS — S336XXA Sprain of sacroiliac joint, initial encounter: Secondary | ICD-10-CM | POA: Diagnosis not present

## 2024-05-30 DIAGNOSIS — M9902 Segmental and somatic dysfunction of thoracic region: Secondary | ICD-10-CM | POA: Diagnosis not present

## 2024-05-30 DIAGNOSIS — S233XXA Sprain of ligaments of thoracic spine, initial encounter: Secondary | ICD-10-CM | POA: Diagnosis not present

## 2024-05-31 DIAGNOSIS — F4323 Adjustment disorder with mixed anxiety and depressed mood: Secondary | ICD-10-CM | POA: Diagnosis not present

## 2024-06-06 DIAGNOSIS — M9902 Segmental and somatic dysfunction of thoracic region: Secondary | ICD-10-CM | POA: Diagnosis not present

## 2024-06-06 DIAGNOSIS — S233XXA Sprain of ligaments of thoracic spine, initial encounter: Secondary | ICD-10-CM | POA: Diagnosis not present

## 2024-06-06 DIAGNOSIS — M9904 Segmental and somatic dysfunction of sacral region: Secondary | ICD-10-CM | POA: Diagnosis not present

## 2024-06-06 DIAGNOSIS — S336XXA Sprain of sacroiliac joint, initial encounter: Secondary | ICD-10-CM | POA: Diagnosis not present

## 2024-06-13 DIAGNOSIS — M9904 Segmental and somatic dysfunction of sacral region: Secondary | ICD-10-CM | POA: Diagnosis not present

## 2024-06-13 DIAGNOSIS — S336XXA Sprain of sacroiliac joint, initial encounter: Secondary | ICD-10-CM | POA: Diagnosis not present

## 2024-06-13 DIAGNOSIS — M9902 Segmental and somatic dysfunction of thoracic region: Secondary | ICD-10-CM | POA: Diagnosis not present

## 2024-06-13 DIAGNOSIS — S233XXA Sprain of ligaments of thoracic spine, initial encounter: Secondary | ICD-10-CM | POA: Diagnosis not present

## 2024-06-14 DIAGNOSIS — F411 Generalized anxiety disorder: Secondary | ICD-10-CM | POA: Diagnosis not present

## 2024-06-20 DIAGNOSIS — S336XXA Sprain of sacroiliac joint, initial encounter: Secondary | ICD-10-CM | POA: Diagnosis not present

## 2024-06-20 DIAGNOSIS — S233XXA Sprain of ligaments of thoracic spine, initial encounter: Secondary | ICD-10-CM | POA: Diagnosis not present

## 2024-06-20 DIAGNOSIS — M9904 Segmental and somatic dysfunction of sacral region: Secondary | ICD-10-CM | POA: Diagnosis not present

## 2024-06-20 DIAGNOSIS — M9902 Segmental and somatic dysfunction of thoracic region: Secondary | ICD-10-CM | POA: Diagnosis not present

## 2024-06-22 DIAGNOSIS — F4323 Adjustment disorder with mixed anxiety and depressed mood: Secondary | ICD-10-CM | POA: Diagnosis not present

## 2024-06-27 DIAGNOSIS — M9904 Segmental and somatic dysfunction of sacral region: Secondary | ICD-10-CM | POA: Diagnosis not present

## 2024-06-27 DIAGNOSIS — F4323 Adjustment disorder with mixed anxiety and depressed mood: Secondary | ICD-10-CM | POA: Diagnosis not present

## 2024-06-27 DIAGNOSIS — S336XXA Sprain of sacroiliac joint, initial encounter: Secondary | ICD-10-CM | POA: Diagnosis not present

## 2024-06-27 DIAGNOSIS — M9902 Segmental and somatic dysfunction of thoracic region: Secondary | ICD-10-CM | POA: Diagnosis not present

## 2024-06-27 DIAGNOSIS — S233XXA Sprain of ligaments of thoracic spine, initial encounter: Secondary | ICD-10-CM | POA: Diagnosis not present

## 2024-07-01 DIAGNOSIS — F411 Generalized anxiety disorder: Secondary | ICD-10-CM | POA: Diagnosis not present

## 2024-07-04 DIAGNOSIS — S233XXA Sprain of ligaments of thoracic spine, initial encounter: Secondary | ICD-10-CM | POA: Diagnosis not present

## 2024-07-04 DIAGNOSIS — S336XXA Sprain of sacroiliac joint, initial encounter: Secondary | ICD-10-CM | POA: Diagnosis not present

## 2024-07-04 DIAGNOSIS — M9904 Segmental and somatic dysfunction of sacral region: Secondary | ICD-10-CM | POA: Diagnosis not present

## 2024-07-04 DIAGNOSIS — M9902 Segmental and somatic dysfunction of thoracic region: Secondary | ICD-10-CM | POA: Diagnosis not present

## 2024-07-06 DIAGNOSIS — F4323 Adjustment disorder with mixed anxiety and depressed mood: Secondary | ICD-10-CM | POA: Diagnosis not present

## 2024-07-11 DIAGNOSIS — S233XXA Sprain of ligaments of thoracic spine, initial encounter: Secondary | ICD-10-CM | POA: Diagnosis not present

## 2024-07-11 DIAGNOSIS — S336XXA Sprain of sacroiliac joint, initial encounter: Secondary | ICD-10-CM | POA: Diagnosis not present

## 2024-07-11 DIAGNOSIS — M9902 Segmental and somatic dysfunction of thoracic region: Secondary | ICD-10-CM | POA: Diagnosis not present

## 2024-07-11 DIAGNOSIS — M9904 Segmental and somatic dysfunction of sacral region: Secondary | ICD-10-CM | POA: Diagnosis not present

## 2024-07-18 DIAGNOSIS — M9904 Segmental and somatic dysfunction of sacral region: Secondary | ICD-10-CM | POA: Diagnosis not present

## 2024-07-18 DIAGNOSIS — S336XXA Sprain of sacroiliac joint, initial encounter: Secondary | ICD-10-CM | POA: Diagnosis not present

## 2024-07-18 DIAGNOSIS — M9902 Segmental and somatic dysfunction of thoracic region: Secondary | ICD-10-CM | POA: Diagnosis not present

## 2024-07-18 DIAGNOSIS — S233XXA Sprain of ligaments of thoracic spine, initial encounter: Secondary | ICD-10-CM | POA: Diagnosis not present

## 2024-07-19 DIAGNOSIS — Z79890 Hormone replacement therapy (postmenopausal): Secondary | ICD-10-CM | POA: Diagnosis not present

## 2024-07-20 DIAGNOSIS — F4323 Adjustment disorder with mixed anxiety and depressed mood: Secondary | ICD-10-CM | POA: Diagnosis not present

## 2024-07-26 DIAGNOSIS — F4323 Adjustment disorder with mixed anxiety and depressed mood: Secondary | ICD-10-CM | POA: Diagnosis not present

## 2024-08-01 DIAGNOSIS — M9904 Segmental and somatic dysfunction of sacral region: Secondary | ICD-10-CM | POA: Diagnosis not present

## 2024-08-01 DIAGNOSIS — M9902 Segmental and somatic dysfunction of thoracic region: Secondary | ICD-10-CM | POA: Diagnosis not present

## 2024-08-01 DIAGNOSIS — S233XXA Sprain of ligaments of thoracic spine, initial encounter: Secondary | ICD-10-CM | POA: Diagnosis not present

## 2024-08-01 DIAGNOSIS — S336XXA Sprain of sacroiliac joint, initial encounter: Secondary | ICD-10-CM | POA: Diagnosis not present

## 2024-08-09 DIAGNOSIS — F4323 Adjustment disorder with mixed anxiety and depressed mood: Secondary | ICD-10-CM | POA: Diagnosis not present

## 2024-09-14 ENCOUNTER — Other Ambulatory Visit: Payer: Self-pay | Admitting: Internal Medicine

## 2024-09-14 DIAGNOSIS — Z1231 Encounter for screening mammogram for malignant neoplasm of breast: Secondary | ICD-10-CM

## 2024-09-16 ENCOUNTER — Other Ambulatory Visit: Payer: Self-pay | Admitting: Obstetrics and Gynecology

## 2024-09-16 DIAGNOSIS — N6001 Solitary cyst of right breast: Secondary | ICD-10-CM

## 2024-09-16 DIAGNOSIS — N644 Mastodynia: Secondary | ICD-10-CM

## 2024-09-23 ENCOUNTER — Inpatient Hospital Stay: Admission: RE | Admit: 2024-09-23 | Payer: Self-pay

## 2024-09-23 ENCOUNTER — Other Ambulatory Visit: Payer: Self-pay

## 2024-09-23 DIAGNOSIS — N644 Mastodynia: Secondary | ICD-10-CM

## 2024-09-23 DIAGNOSIS — N6001 Solitary cyst of right breast: Secondary | ICD-10-CM
